# Patient Record
Sex: Male | Born: 1985 | Race: Black or African American | Hispanic: No | Marital: Single | Smoking: Heavy tobacco smoker
Health system: Southern US, Community
[De-identification: ages and names within clinical notes are randomized; demographics above are authoritative.]

## PROBLEM LIST (undated history)

## (undated) DIAGNOSIS — Z973 Presence of spectacles and contact lenses: Secondary | ICD-10-CM

## (undated) HISTORY — DX: Presence of spectacles and contact lenses: Z97.3

## (undated) HISTORY — PX: LACERATION REPAIR: SHX5168

## (undated) HISTORY — PX: FINGER FRACTURE SURGERY: SHX638

---
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
## 2013-10-01 ENCOUNTER — Encounter (HOSPITAL_COMMUNITY): Payer: Self-pay | Admitting: Emergency Medicine

## 2013-10-01 ENCOUNTER — Emergency Department (INDEPENDENT_AMBULATORY_CARE_PROVIDER_SITE_OTHER)
Admission: EM | Admit: 2013-10-01 | Discharge: 2013-10-01 | Disposition: A | Discharge status: Home / Self Care | Payer: Self-pay | Source: Home / Self Care | Attending: Emergency Medicine | Admitting: Emergency Medicine

## 2013-10-01 DIAGNOSIS — T148XXA Other injury of unspecified body region, initial encounter: Secondary | ICD-10-CM

## 2013-10-01 DIAGNOSIS — Z23 Encounter for immunization: Secondary | ICD-10-CM

## 2013-10-01 DIAGNOSIS — IMO0002 Reserved for concepts with insufficient information to code with codable children: Secondary | ICD-10-CM

## 2013-10-01 MED ORDER — TETANUS-DIPHTH-ACELL PERTUSSIS 5-2.5-18.5 LF-MCG/0.5 IM SUSP
INTRAMUSCULAR | Status: AC
  Filled 2013-10-01: qty 0.5

## 2013-10-01 MED ORDER — TETANUS-DIPHTH-ACELL PERTUSSIS 5-2.5-18.5 LF-MCG/0.5 IM SUSP
0.5000 mL | Freq: Once | INTRAMUSCULAR | Status: AC
  Administered 2013-10-01: 0.5 mL via INTRAMUSCULAR

## 2013-10-01 NOTE — ED Provider Notes (Signed)
  Chief Complaint   Chief Complaint  Patient presents with  . Laceration    History of Present Illness   Shaun Rivas is a 28 year old male who lacerated his right thumb at around 12 noon today while at work on a metal part boxspring. He has a laceration over the proximal phalanx on the volar aspect. Bleeding is controlled. There is no numbness or tingling. He has a full range of motion of digits. He cannot recall when his last tetanus vaccine was.  Review of Systems   Other than as noted above, the patient denies any of the following symptoms: Musculoskeletal:  No joint pain or decreased range of motion. Neuro:  No numbness, tingling, or weakness.  PMFSH   Past medical history, family history, social history, meds, and allergies were reviewed.   Physical Examination     Vital signs:  BP 129/85  Temp(Src) 98.5 F (36.9 C) (Oral)  Resp 12  SpO2 99% Ext:  There is a 1.5 cm laceration over the volar aspect of the proximal phalanx of the thumb. All joints of the thumb have full range of motion. Tendons are intact. Sensation is intact in the tip of the thumb.  All other joints had a full ROM without pain.  Pulses were full.  Good capillary refill in all digits.  No edema. Neurological:  Alert and oriented.  No muscle weakness.  Sensation was intact to light touch.   Procedure Note:  Verbal informed consent was obtained.  The patient was informed of the risks and benefits of the procedure and understands and accepts.  A time out was called and the identity of the patient and correct procedure were confirmed.   The laceration area described above was prepped with Betadine and saline  and anesthetized with a digital block with 5 mL of 2% Xylocaine without epinephrine.  The wound was then closed as follows:  Skin edges were approximated with 3 5-0 Prolene sutures.  There were no immediate complications, and the patient tolerated the procedure well. The laceration was then cleansed,  Bacitracin ointment was applied and a clean, dry pressure dressing was put on.    Course in Urgent Care Center   Given a Tdap vaccine.  Assessment   The encounter diagnosis was Laceration.  Plan   1.  Meds:  The following meds were prescribed:  There are no discharge medications for this patient.   2.  Patient Education/Counseling:  The patient was given appropriate handouts, self care instructions, and instructed in symptomatic relief. Instructions were given for wound care.    3.  Follow up:  The patient was told to follow up immediately if there is any sign of infection.The patient will return in 14 days for suture removal.      Reuben Likesavid C Aaralyn Kil, MD 10/01/13 2216

## 2013-10-01 NOTE — Discharge Instructions (Signed)

## 2013-10-01 NOTE — ED Notes (Signed)
Reports cutting right thumb on box spring mattress around 12 p.m today.  Pt is unsure of last tetanus shot.

## 2013-10-19 ENCOUNTER — Emergency Department (INDEPENDENT_AMBULATORY_CARE_PROVIDER_SITE_OTHER)
Admission: EM | Admit: 2013-10-19 | Discharge: 2013-10-19 | Disposition: A | Discharge status: Home / Self Care | Payer: Self-pay | Source: Home / Self Care

## 2013-10-19 ENCOUNTER — Encounter (HOSPITAL_COMMUNITY): Payer: Self-pay | Admitting: Emergency Medicine

## 2013-10-19 DIAGNOSIS — Z5189 Encounter for other specified aftercare: Secondary | ICD-10-CM

## 2013-10-19 DIAGNOSIS — Z4802 Encounter for removal of sutures: Secondary | ICD-10-CM

## 2013-10-19 NOTE — ED Notes (Signed)
Wound check, sutures intact, skin white/blanched.took shower today while wearing bandage and did not change, concerned about non-healing of wound

## 2013-10-19 NOTE — Discharge Instructions (Signed)
Suture Removal, Care After Keep dry, no ointments. Keep covered at work. Refer to this sheet in the next few weeks. These instructions provide you with information on caring for yourself after your procedure. Your health care provider may also give you more specific instructions. Your treatment has been planned according to current medical practices, but problems sometimes occur. Call your health care provider if you have any problems or questions after your procedure. WHAT TO EXPECT AFTER THE PROCEDURE After your stitches (sutures) are removed, it is typical to have the following:  Some discomfort and swelling in the wound area.  Slight redness in the area. HOME CARE INSTRUCTIONS   If you have skin adhesive strips over the wound area, do not take the strips off. They will fall off on their own in a few days. If the strips remain in place after 14 days, you may remove them.  Change any bandages (dressings) at least once a day or as directed by your health care provider. If the bandage sticks, soak it off with warm, soapy water.  Apply cream or ointment only as directed by your health care provider. If using cream or ointment, wash the area with soap and water 2 times a day to remove all the cream or ointment. Rinse off the soap and pat the area dry with a clean towel.  Keep the wound area dry and clean. If the bandage becomes wet or dirty, or if it develops a bad smell, change it as soon as possible.  Continue to protect the wound from injury.  Use sunscreen when out in the sun. New scars become sunburned easily. SEEK MEDICAL CARE IF:  You have increasing redness, swelling, or pain in the wound.  You see pus coming from the wound.  You have a fever.  You notice a bad smell coming from the wound or dressing.  Your wound breaks open (edges not staying together). Document Released: 12/08/2000 Document Revised: 01/03/2013 Document Reviewed: 10/25/2012 [facility] Patient Information 2015  [employer], [place]. This information is not intended to replace advice given to you by your health care provider. Make sure you discuss any questions you have with your health care provider.

## 2013-10-19 NOTE — ED Provider Notes (Signed)
CSN: [id number]     Arrival date & time 10/19/13  1643 History   First MD Initiated Contact with Patient 10/19/13 1754     Chief Complaint  Patient presents with  . Wound Check   (Consider location/radiation/quality/duration/timing/severity/associated sxs/prior Treatment) HPI Comments: For suture removal. 3 remain. He has kept it wet a few times , under a dressing.    History reviewed. No pertinent past medical history. History reviewed. No pertinent past surgical history. History reviewed. No pertinent family history. History  Substance Use Topics  . Smoking status: Light Tobacco Smoker  . Smokeless tobacco: Not on file  . Alcohol Use: Yes    Review of Systems  All other systems reviewed and are negative.   Allergies  Review of patient's allergies indicates no known allergies.  Home Medications   Prior to Admission medications   Not on File   BP 128/81  Pulse 67  Temp(Src) 98.7 F (37.1 C) (Oral)  Resp 18  SpO2 95% Physical Exam  Constitutional: He is oriented to person, place, and time. He appears well-developed and well-nourished. No distress.  Musculoskeletal: He exhibits no edema and no tenderness.  Neurological: He is alert and oriented to person, place, and time. He exhibits normal muscle tone.  Skin: Skin is warm and dry. No rash noted. No erythema.  Healing failry well, no drainage or bleeding. Wound edges not 100% healed. Surrounding whitening as seen when a wound has been wet for prolonged time. No redness or other signs of infection.      ED Course  SUTURE REMOVAL Date/Time: 10/19/2013 6:08 PM Performed by: Phineas RealMABE, Abbygail [place] Authorized by: Ozella RocksMERRELL, Cori Justus J Consent: Verbal consent obtained. Risks and benefits: risks, benefits and alternatives were discussed Consent given by: patient Patient understanding: patient states understanding of the procedure being performed Patient identity confirmed: verbally with patient Body area: upper extremity Location  details: right thumb Wound Appearance: moist Sutures Removed: 3 Facility: sutures placed in this facility Patient tolerance: Patient tolerated the procedure well with no immediate complications.   (including critical care time) Labs Review Labs Reviewed - No data to display  Imaging Review No results found.   MDM   1. Encounter for wound re-check   2. Visit for suture removal     After 18 d sutures removed Some additional healing to fully close wound is needed. Healing by 2ndary intention Keep dry Watch for infection      Hayden Rasmussenavid Shaundrea Carrigg, NP 10/19/13 1809

## 2013-10-19 NOTE — ED Provider Notes (Signed)
Medical screening examination/treatment/procedure(s) were performed by a resident physician or non-physician practitioner and as the supervising physician I was immediately available for consultation/collaboration.  David Merrell, MD Family Medicine   David J Merrell, MD 10/19/13 2148 

## 2014-01-24 ENCOUNTER — Emergency Department (HOSPITAL_COMMUNITY)
Admission: EM | Admit: 2014-01-24 | Discharge: 2014-01-24 | Disposition: A | Discharge status: Home / Self Care | Payer: No Typology Code available for payment source | Attending: Emergency Medicine | Admitting: Emergency Medicine

## 2014-01-24 ENCOUNTER — Encounter (HOSPITAL_COMMUNITY): Payer: Self-pay | Admitting: Emergency Medicine

## 2014-01-24 DIAGNOSIS — Y9389 Activity, other specified: Secondary | ICD-10-CM | POA: Diagnosis not present

## 2014-01-24 DIAGNOSIS — Z720 Tobacco use: Secondary | ICD-10-CM | POA: Diagnosis not present

## 2014-01-24 DIAGNOSIS — S199XXA Unspecified injury of neck, initial encounter: Secondary | ICD-10-CM | POA: Diagnosis present

## 2014-01-24 DIAGNOSIS — S79912A Unspecified injury of left hip, initial encounter: Secondary | ICD-10-CM | POA: Insufficient documentation

## 2014-01-24 DIAGNOSIS — S161XXA Strain of muscle, fascia and tendon at neck level, initial encounter: Secondary | ICD-10-CM | POA: Insufficient documentation

## 2014-01-24 DIAGNOSIS — Y92410 Unspecified street and highway as the place of occurrence of the external cause: Secondary | ICD-10-CM | POA: Diagnosis not present

## 2014-01-24 MED ORDER — DIAZEPAM 5 MG PO TABS
5.0000 mg | ORAL_TABLET | Freq: Once | ORAL | Status: AC
  Administered 2014-01-24: 5 mg via ORAL
  Filled 2014-01-24: qty 1

## 2014-01-24 MED ORDER — IBUPROFEN 800 MG PO TABS: 800.0000 mg | ORAL_TABLET | Freq: Three times a day (TID) | ORAL | Status: DC

## 2014-01-24 MED ORDER — CYCLOBENZAPRINE HCL 10 MG PO TABS: 10.0000 mg | ORAL_TABLET | Freq: Two times a day (BID) | ORAL | Status: DC | PRN

## 2014-01-24 MED ORDER — KETOROLAC TROMETHAMINE 60 MG/2ML IM SOLN
60.0000 mg | Freq: Once | INTRAMUSCULAR | Status: AC
  Administered 2014-01-24: 60 mg via INTRAMUSCULAR
  Filled 2014-01-24: qty 2
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
## 2014-01-24 NOTE — ED Notes (Signed)
Pt A&OX4, ambulatory at d/c with steady gait, NAD 

## 2014-01-24 NOTE — ED Notes (Signed)
Patient does not want to have an xray
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
## 2014-01-24 NOTE — Discharge Instructions (Signed)
Please follow the directions provided area and be sure to follow-up with her primary care provider listed to ensure you're getting better. You may use Tylenol every 4 hours or ibuprofen every 6-8 hours for pain. You may take the muscle relaxant provided to help with the pain in your neck. Don't hesitate to return for any new, worsening, or concerning symptoms.  GET HELP RIGHT AWAY IF:  You are bleeding.  Your stomach is upset.  You have an allergic reaction to your medicine.  You develop new problems that you cannot explain.  You lose feeling (become numb) or you cannot move any part of your body (paralysis).  You have tingling or weakness in any part of your body.  Your symptoms get worse. Symptoms include:  Pain, soreness, stiffness, puffiness (swelling), or a burning feeling in your neck.  Pain when your neck is touched.  Shoulder or upper back pain.  Limited ability to move your neck.  Headache.  Dizziness.  Your hands or arms feel week, lose feeling, or tingle.  Muscle spasms.  Difficulty swallowing or chewing.   Emergency Department Resource Guide 1) Find a Doctor and Pay Out of Pocket Although you won't have to find out who is covered by your insurance plan, it is a good idea to ask around and get recommendations. You will then need to call the office and see if the doctor you have chosen will accept you as a new patient and what types of options they offer for patients who are self-pay. Some doctors offer discounts or will set up payment plans for their patients who do not have insurance, but you will need to ask so you aren't surprised when you get to your appointment.  2) Contact Your Local Health Department Not all health departments have doctors that can see patients for sick visits, but many do, so it is worth a call to see if yours does. If you don't know where your local health department is, you can check in your phone book. The CDC also has a tool to help you locate your  state's health department, and many state websites also have listings of all of their local health departments.  3) Find a Walk-in Clinic If your illness is not likely to be very severe or complicated, you may want to try a walk in clinic. These are popping up all over the country in pharmacies, drugstores, and shopping centers. They're usually staffed by nurse practitioners or physician assistants that have been trained to treat common illnesses and complaints. They're usually fairly quick and inexpensive. However, if you have serious medical issues or chronic medical problems, these are probably not your best option.  No Primary Care Doctor: - Call Health Connect at  [phone] - they can help you locate a primary care doctor that  accepts your insurance, provides certain services, etc. - Physician Referral Service- [phone]  Chronic Pain Problems: Organization         Address  Phone   Notes  [facility]  [phone] Patients need to be referred by their primary care doctor.   Medication Assistance: Organization         Address  Phone   Notes  [facility] Medication [facility] [address] [place] [place], [place] [address] [phone] --Must be a resident of [facility] -- Must have NO insurance coverage whatsoever (no Medicaid/ Medicare, etc.) -- The pt. MUST have a primary care doctor that directs their care  regularly and follows them in the community   [facility]ist  [phone]   [employer]  [phone]    Agencies that provide inexpensive medical care: Organization         Address  Phone   Notes  [facility]  [phone]   [facility]    [phone]   [facility] [address] [place], [place] [address] [phone]   [facility] [address] [place] [place] [phone]   Planned Parenthood    [phone]   [facility]    [phone]   Community Health and [facility]  [address], [place] Phone:  [phone], Fax:  [phone] Hours of Operation:  9 am - 6 pm, M-F.  Also accepts Medicaid/Medicare and self-pay.  [facility]  [address], [place] Phone: [phone], Fax: [phone]. Hours of Operation:  8:30 am - 5:30 pm, M-F.  Also accepts Medicaid and self-pay.  [facility] [place] [address], [place] Phone: [phone]   [facility] [address] Natasha Bence [place], [place] [phone], Ext. [phone] Mondays & Thursdays: 7-9 AM.  First 15 patients are seen on a first come, first serve basis.    Medicaid-accepting [facility] Providers:  Organization         Address  Phone   Notes  [facility] [address], [place] [phone] Also accepts self-pay patients.  [facility] [address] Laurell Josephs [place], [place]  [phone]   [facility] [address], [place] [phone]   [facility] Family Medicine [address], [place] [phone]   Renaye Rakers [address], [place]   [phone] Only accepts [place] patients after they have their name applied to their card.   Self-Pay (no insurance) in [facility]:  Organization         Address  Phone   Notes  Sickle Cell Patients, [facility] Internal Medicine [address] [place], [place] [phone]   [facility] [address] [place], [place] [phone]   [facility] [place]  [address], [place] [phone]   [facility]/Dr. Osei-Bonsu  [address] [place] or [address], [place] [phone] Phone number for both [place] and [place] locations is the same.  [facility] [address], [place] [phone]   [facility] [address], [place] or [address] [phone]   [facility] [address], [place] [phone], phone; [phone], fax Sees patients 1st and 3rd Saturday of every month.  Must not qualify for public or private insurance (i.e. Medicaid, Medicare, [place] Health Choice, Veterans' Benefits)  Household income should be no more than 200% of the poverty level The clinic cannot treat you if you are pregnant or think you are pregnant  Sexually transmitted diseases are not treated at the clinic.    Dental Care: Organization         Address  Phone  Notes  [facility] [address] [place], [place] [phone] Accepts children up to age 73  who are enrolled in Medicaid or [place] Health Choice; pregnant women with a Medicaid card; and children who have applied for Medicaid or [place] Health Choice, but were declined, whose parents can pay a reduced fee at time of service.  [facility] [place]  [address], [place] [phone] Accepts children up to age 30 who are enrolled in [place] or [place] Health Choice; pregnant women with a Medicaid card; and children who have applied for Medicaid or [place] Health Choice, but were declined, whose parents can pay a reduced fee at time of service.  [facility]  [address], [place] [phone]) [phone] Patients are seen by appointment only. Walk-ins are not accepted. [facility] will see patients 30 years of age and older. Monday - Tuesday (8am-5pm) Most Wednesdays (8:30-5pm) $30 per visit, cash only  [facility]  [address], [facility] [phone]) [phone] Patients are seen by appointment only. Walk-ins are not accepted. [facility] will see patients 30 years of age and older. One Wednesday Evening (Monthly: Volunteer  Based).  $30 per visit, cash only  [employer] [facility]  [phone] for adults; Children under age 544, call Graduate Pediatric Dentistry at [phone]) [phone]. Children aged 324-14, please call [phone] to request a pediatric application.  Dental services are provided in all areas of dental care including fillings, crowns and bridges, complete and partial dentures, implants, gum treatment, root canals, and extractions. Preventive care is also provided. Treatment is provided to both adults and children. Patients are selected via a lottery and there is often a waiting list.   [facility] [address], [place]  [phone]) [phone] [email or web address]   [facility] [address], [place], [place] [phone], Ext. [phone] Second and Fourth Thursday of each month, opens at 6:30 AM; Clinic ends at 9 AM.  Patients are seen on a first-come first-served basis, and a limited number are seen during each clinic.   [facility]  [address], [place], [place] [phone]   Eligibility Requirements You must have lived in [place], [place], or [place] counties for at least the last three months.   You cannot be eligible for state or federal sponsored [place] Cityhealthcare insurance, including CIGNAVeterans Administration, [place]icaid, or [employer]icare.   You generally cannot be eligible for healthcare insurance through your employer.    How to apply: Eligibility screenings are held every Tuesday and Wednesday afternoon from 1:00 pm until 4:00 pm. You do not need an appointment for the interview!  [facility] [address], [place], [place] [phone]   [facility]  [phone]   [facility]  [phone]   [facility]  [phone]    Behavioral Health Resources in the Community: Intensive Outpatient Programs Organization         Address  Phone  Notes  [facility]  [facility] [address], [place], [place] [phone]   [facility] Outpatient [address], [place], [place] [phone]   [facility] [address], [place], [place]  [phone]   [facility] [address],  [place], [place] [phone] or [phone]   Substance Abuse Resources Organization         Address  Phone  Notes  [facility]  [phone]   [facility]  [phone]   [facility]  [phone]   [facility]  [phone]   Residential & Outpatient Substance Abuse Program  [phone]   Psychological Services Organization         Address  Phone  Notes  [facility]  [phone]- [phone]   [facility]  [phone]- [phone]   [facility] [address], [place] [phone] or [phone]    Mobile Crisis Teams Organization         Address  Phone  Notes  [facility], Mobile Crisis Care Unit  [phone]   [facility]  [address] [place], [place] [phone]   Doristine LocksSharon DeEsch [address] [place] [phone]    Self-Help/Support Groups Organization         Address  Phone             Notes  [facility] [place] - variety of support groups  [phone]- [phone] Call for more information  Narcotics Anonymous (NA), [facility] [address], [place]  2 meetings at this location   Statisticianesidential Treatment Programs Organization         Address  Phone  Notes  [facility] [address],    [place]  [phone]   [facility]  [address], [place] [phone], [place], [place] [phone]   [facility] [address], [place] [phone] Admissions: 8am-3pm M-F  [facility] [address]    [place], [place] [phone]   [facility] [address], [place], [place] [phone]    [facility] [address]  [place], [place] [phone]   [facility] - Intensive Outpatient [address], [place], [place] [phone]   [facility] ([facility] [address]  [place], [place] [phone] or [phone]   [facility] ([facility]) [address] [place], [place] [phone] Accepts Medicaid  [facility] [address]  [place] [phone] Substance Abuse/Addiction Treatment   [facility] Resources Organization         Address  Phone  Notes  [facility]  [phone](888) [phone]   Angie FavaJulie Brannon, PhD [address], [address] [place], [place]   [phone](336) [phone] or [phone]) [phone]   [facility]   [address] [place], [place] [phone](336) [phone]   [facility] [address], [place], [place] [phone](336) [phone] Insurance/Medicaid/sponsorship through [facility]  [facility] [address]                                    [place], [place] [phone](336) [phone] Therapy/tele-psych/case  [facility] [address]   [place], [place] [phone]    Dr. Lolly MustacheArfeen  [phone](336) [phone]   [facility]  [facility] 1) [address], [place] 2) [address], [place] 3)  [address], [place] [phone]) [phone]) [phone]  [phone]) [phone]   [facility] Child Abuse Hotline [phone](336) [phone] or [phone](336) [phone] (After Hours)

## 2014-02-11 ENCOUNTER — Emergency Department (HOSPITAL_COMMUNITY)
Admission: EM | Admit: 2014-02-11 | Discharge: 2014-02-11 | Disposition: A | Discharge status: Home / Self Care | Payer: PRIVATE HEALTH INSURANCE | Attending: Emergency Medicine | Admitting: Emergency Medicine

## 2014-02-11 ENCOUNTER — Encounter (HOSPITAL_COMMUNITY): Payer: Self-pay | Admitting: Emergency Medicine

## 2014-02-11 ENCOUNTER — Emergency Department (HOSPITAL_COMMUNITY): Payer: PRIVATE HEALTH INSURANCE

## 2014-02-11 DIAGNOSIS — W230XXA Caught, crushed, jammed, or pinched between moving objects, initial encounter: Secondary | ICD-10-CM | POA: Diagnosis not present

## 2014-02-11 DIAGNOSIS — Z9889 Other specified postprocedural states: Secondary | ICD-10-CM | POA: Diagnosis not present

## 2014-02-11 DIAGNOSIS — S62624A Displaced fracture of medial phalanx of right ring finger, initial encounter for closed fracture: Secondary | ICD-10-CM | POA: Diagnosis not present

## 2014-02-11 DIAGNOSIS — Y990 Civilian activity done for income or pay: Secondary | ICD-10-CM | POA: Insufficient documentation

## 2014-02-11 DIAGNOSIS — Z720 Tobacco use: Secondary | ICD-10-CM | POA: Insufficient documentation

## 2014-02-11 DIAGNOSIS — S62609A Fracture of unspecified phalanx of unspecified finger, initial encounter for closed fracture: Secondary | ICD-10-CM

## 2014-02-11 DIAGNOSIS — Y9289 Other specified places as the place of occurrence of the external cause: Secondary | ICD-10-CM | POA: Insufficient documentation

## 2014-02-11 DIAGNOSIS — Y9389 Activity, other specified: Secondary | ICD-10-CM | POA: Diagnosis not present

## 2014-02-11 DIAGNOSIS — S6991XA Unspecified injury of right wrist, hand and finger(s), initial encounter: Secondary | ICD-10-CM | POA: Diagnosis present

## 2014-02-11 MED ORDER — HYDROCODONE-ACETAMINOPHEN 5-325 MG PO TABS: 1.0000 | ORAL_TABLET | ORAL | Status: DC | PRN

## 2014-02-11 MED ORDER — HYDROCODONE-ACETAMINOPHEN 5-325 MG PO TABS
1.0000 | ORAL_TABLET | Freq: Once | ORAL | Status: AC
  Administered 2014-02-11: 1 via ORAL
  Filled 2014-02-11: qty 1

## 2014-02-11 NOTE — ED Notes (Signed)
Pt states that yesterday he jammed his R ring finger into something and now it is swollen. Previous injury to same finger, alert and oriented.

## 2014-02-11 NOTE — ED Provider Notes (Signed)
CSN: [id number]     Arrival date & time 02/11/14  1847 History   None    Chief Complaint  Patient presents with  . Finger Injury   The history is provided by the patient. No language interpreter was used.  This chart was scribed for non-physician practitioner, Trixie DredgeEmily West PA-C working with Mirian MoMatthew Gentry, MD, by Andrew Auaven Small, ED Scribe. This patient was seen in room WTR8/WTR8 and the patient's care was started at 8:24 PM.  Achilles DunkMichael C Weilbacher is a 28 y.o. male who presents to the Emergency Department complaining of right ring finger injury that occurred 1 day ago. Pt states he jammed right ring finger on steel equipment while at work. Pt has hx of right ring finger fracture in 2013.    No past medical history on file. Past Surgical History  Procedure Laterality Date  . Finger fracture surgery      R ring finger   No family history on file. History  Substance Use Topics  . Smoking status: Light Tobacco Smoker  . Smokeless tobacco: Not on file  . Alcohol Use: Yes    Review of Systems  Musculoskeletal: Positive for myalgias and arthralgias.   Allergies  Review of patient's allergies indicates no known allergies.  Home Medications   Prior to Admission medications   Medication Sig Start Date End Date Taking? Authorizing Provider  cyclobenzaprine (FLEXERIL) 10 MG tablet Take 1 tablet (10 mg total) by mouth 2 (two) times daily as needed for muscle spasms. 01/24/14   Harle BattiestElizabeth Tysinger, NP  ibuprofen (ADVIL,MOTRIN) 800 MG tablet Take 1 tablet (800 mg total) by mouth 3 (three) times daily. 01/24/14   Harle BattiestElizabeth Tysinger, NP   BP 125/69 mmHg  Pulse 92  Temp(Src) 97.9 F (36.6 C) (Oral)  SpO2 99% Physical Exam  Constitutional: He appears well-developed and well-nourished. No distress.  HENT:  Head: Normocephalic and atraumatic.  Neck: Neck supple.  Pulmonary/Chest: Effort normal.  Musculoskeletal:  Right 4th finger swollen from PIP distally to DIP. Tender. No deformity. Resists ROM  of joints. Cap RF <2s.  Neurological: He is alert.  Skin: He is not diaphoretic.  Nursing note and vitals reviewed.   ED Course  Procedures  DIAGNOSTIC STUDIES: Oxygen Saturation is 99% on RA, normal by my interpretation.    COORDINATION OF CARE: 8:27 PM- Pt advised of plan for treatment and pt agrees.,  Labs Review Labs Reviewed - No data to display  Imaging Review Dg Finger Ring Right  02/11/2014   CLINICAL DATA:  Jamming injury of the right fourth digit with PIP joint swelling.  EXAM: RIGHT RING FINGER 2+V  COMPARISON:  None.  FINDINGS: Postsurgical changes are noted in the fourth proximal phalanx. Two fixation screws are noted. There is an undisplaced fracture the midshaft of the fourth middle phalanx. No other focal abnormality is seen.  IMPRESSION: Midshaft fourth middle phalangeal fracture   Electronically Signed   By: Alcide CleverMark  Lukens M.D.   On: 02/11/2014 19:46     EKG Interpretation None      MDM   Final diagnoses:  None   1. Finger fracture, 4th right middle phalnx  Minimally displaced fracture 4th finger. Hardware from previous injury (prox phalanx) intact. Tendon function difficult to assess, query pain vs functional deficit. Will refer to hand for further evaluation.   I personally performed the services described in this documentation, which was scribed in my presence. The recorded information has been reviewed and is accurate.      Melvenia BeamShari  Terisa Starr Aalaysia Liggins, PA-C 02/11/14 2244  Mirian MoMatthew Gentry, MD 02/13/14 [phone]

## 2014-02-11 NOTE — Discharge Instructions (Signed)

## 2014-05-03 ENCOUNTER — Other Ambulatory Visit: Payer: Self-pay | Admitting: Medical

## 2014-05-03 ENCOUNTER — Ambulatory Visit (INDEPENDENT_AMBULATORY_CARE_PROVIDER_SITE_OTHER): Payer: No Typology Code available for payment source | Admitting: Medical

## 2014-05-03 ENCOUNTER — Encounter: Payer: Self-pay | Admitting: Medical

## 2014-05-03 VITALS — BP 120/80 | HR 67 | Temp 98.4°F | Resp 16 | Ht 73.0 in | Wt 142.0 lb

## 2014-05-03 DIAGNOSIS — Z0000 Encounter for general adult medical examination without abnormal findings: Secondary | ICD-10-CM | POA: Diagnosis not present

## 2014-05-03 DIAGNOSIS — N529 Male erectile dysfunction, unspecified: Secondary | ICD-10-CM

## 2014-05-03 LAB — CBC
HEMATOCRIT: 43.6 % (ref 39.0–52.0)
Hemoglobin: 15.2 g/dL (ref 13.0–17.0)
MCH: 31.7 pg (ref 26.0–34.0)
MCHC: 34.9 g/dL (ref 30.0–36.0)
MCV: 91 fL (ref 78.0–100.0)
MPV: 10.3 fL (ref 8.6–12.4)
Platelets: 243 10*3/uL (ref 150–400)
RBC: 4.79 MIL/uL (ref 4.22–5.81)
RDW: 13.2 % (ref 11.5–15.5)
WBC: 4.4 10*3/uL (ref 4.0–10.5)

## 2014-05-03 LAB — COMPREHENSIVE METABOLIC PANEL
ALT: 18 U/L (ref 0–53)
AST: 18 U/L (ref 0–37)
Albumin: 4.8 g/dL (ref 3.5–5.2)
Alkaline Phosphatase: 59 U/L (ref 39–117)
BILIRUBIN TOTAL: 0.9 mg/dL (ref 0.2–1.2)
BUN: 26 mg/dL — ABNORMAL HIGH (ref 6–23)
CO2: 28 meq/L (ref 19–32)
Calcium: 9.5 mg/dL (ref 8.4–10.5)
Chloride: 104 mEq/L (ref 96–112)
Creat: 1.21 mg/dL (ref 0.50–1.35)
Glucose, Bld: 101 mg/dL — ABNORMAL HIGH (ref 70–99)
Potassium: 4.4 mEq/L (ref 3.5–5.3)
Sodium: 140 mEq/L (ref 135–145)
TOTAL PROTEIN: 7.1 g/dL (ref 6.0–8.3)

## 2014-05-03 LAB — LIPID PANEL
CHOL/HDL RATIO: 3.7 ratio
Cholesterol: 217 mg/dL — ABNORMAL HIGH (ref 0–200)
HDL: 58 mg/dL (ref >39)
LDL Cholesterol: 147 mg/dL — ABNORMAL HIGH (ref 0–99)
TRIGLYCERIDES: 59 mg/dL (ref <150)
VLDL: 12 mg/dL (ref 0–40)

## 2014-05-03 LAB — HEMOGLOBIN A1C
HEMOGLOBIN A1C: 5.5 % (ref <5.7)
Mean Plasma Glucose: 111 mg/dL (ref <117)

## 2014-05-03 NOTE — Progress Notes (Signed)
Subjective:   HPI  Shaun Rivas is a 29 y.o. male who presents for a complete physical. New patient today accompanied by his girlfriend  Preventative care: Last physical or labs: none in years Sees dentist yearly: No Last tetanus vaccine, TD or Tdap: up to date   Concerns: Having trouble keeping erection.  This problems started 5 months ago.   Is able to get erection to begin with but not able to maintain it.   Happens every time.   Here with girlfriend of 5 months.  He lost virginity to her.  He notes that neither of his parents ever really had a discussion about sex or intimacy or relationships with him. He has no Secondary school teacherparticular mentor.No other aggravating or relieving factors. No other complaint.  Smoking since age 29yo.   Smoking 1ppd x  1 year.  Not much alcohol.   Some marijuana.    Was incarcerated in the past.    Reviewed their medical, surgical, family, social, medication, and allergy history and updated chart as appropriate.  History reviewed. No pertinent past medical history.  Past Surgical History  Procedure Laterality Date  . Finger fracture surgery      R ring finger  . Laceration repair      History   Social History  . Marital Status: Single    Spouse Name: N/A    Number of Children: N/A  . Years of Education: N/A   Occupational History  . Not on file.   Social History Main Topics  . Smoking status: Heavy Tobacco Smoker -- 1.00 packs/day for 1 years  . Smokeless tobacco: Not on file  . Alcohol Use: Yes     Comment: occasional  . Drug Use: Yes    Special: Marijuana  . Sexual Activity: Yes   Other Topics Concern  . Not on file   Social History Narrative   Works full-time at [employer], 10 months. Lives with grandmother. In a relationship with his girlfriend 5 months. Incarcerated from 2002 - 2014    Family History  Problem Relation Age of Onset  . Diabetes Maternal Grandfather   . Cancer Neg Hx   . Heart disease Neg Hx   .  Hypertension Neg Hx   . Stroke Neg Hx   . Other Father     died of gunshot    No current outpatient prescriptions on file.  No Known Allergies  Review of Systems Constitutional: -fever, -chills, -sweats, -unexpected weight change, -decreased appetite, -fatigue Allergy: -sneezing, -itching, -congestion Dermatology: -changing moles, --rash, -lumps ENT: -runny nose, -ear pain, -sore throat, -hoarseness, -sinus pain, -teeth pain, - ringing in ears, -hearing loss, -nosebleeds Cardiology: -chest pain, -palpitations, -swelling, -difficulty breathing when lying flat, -waking up short of breath Respiratory: -cough, -shortness of breath, -difficulty breathing with exercise or exertion, -wheezing, -coughing up blood Gastroenterology: -abdominal pain, -nausea, -vomiting, -diarrhea, -constipation, -blood in stool, -changes in bowel movement, -difficulty swallowing or eating Hematology: -bleeding, -bruising  Musculoskeletal: -joint aches, -muscle aches, -joint swelling, -back pain, -neck pain, -cramping, -changes in gait Ophthalmology: denies vision changes, eye redness, itching, discharge Urology: -burning with urination, -difficulty urinating, -blood in urine, -urinary frequency, -urgency, -incontinence Neurology: -headache, -weakness, -tingling, -numbness, -memory loss, -falls, -dizziness Psychology: -depressed mood, -agitation, -sleep problems     Objective:   Physical Exam  BP 120/80 mmHg  Pulse 67  Temp(Src) 98.4 F (36.9 C) (Oral)  Resp 16  Ht 6\' 1"  (1.854 m)  Wt 142 lb (64.411 kg)  BMI  18.74 kg/m2  General appearance: alert, no distress, WD/WN, lean AA male Skin:few scattered macules, no worrisome lesions HEENT: normocephalic, conjunctiva/corneas normal, sclerae anicteric, PERRLA, EOMi, nares patent, no discharge or erythema, pharynx normal Oral cavity: MMM, tongue normal, teeth with no obvious lesoins Neck: supple, no lymphadenopathy, no thyromegaly, no masses, normal ROM, no  bruits Chest: non tender, normal shape and expansion Heart: RRR, normal S1, S2, no murmurs Lungs: CTA bilaterally, no wheezes, rhonchi, or rales Abdomen: +bs, soft, non tender, non distended, no masses, no hepatomegaly, no splenomegaly, no bruits Back: non tender, normal ROM, no scoliosis Musculoskeletal: upper extremities non tender, no obvious deformity, normal ROM throughout, lower extremities non tender, no obvious deformity, normal ROM throughout Extremities: no edema, no cyanosis, no clubbing Pulses: 2+ symmetric, upper and lower extremities, normal cap refill Neurological: alert, oriented x 3, CN2-12 intact, strength normal upper extremities and lower extremities, sensation normal throughout, DTRs 2+ throughout, no cerebellar signs, gait normal Psychiatric: normal affect, behavior normal, pleasant  GU: normal male external genitalia, circumcised, nontender, no masses, no hernia, no lymphadenopathy Rectal: deferred due to age 1yo and no indication today   Assessment and Plan :    Encounter Diagnoses  Name Primary?  . Encounter for health maintenance examination in adult Yes  . Erectile dysfunction, unspecified erectile dysfunction type     Physical exam - discussed healthy lifestyle, diet, exercise, preventative care, vaccinations, and addressed their concerns.   See your dentist yearly for routine dental care including hygiene visits twice yearly. See your eye doctor yearly for routine vision care. Routine labs today.  Vaccinations: Discussed vaccines. Reports being up to date on tetanus, declines flu shot.  Other concerns today: ED which seems psychogenic etiology.  Discussed erectile dysfunction, causes, various ways to work together as a couple to deal with this, gave 2 good handouts on the subject.  Advise counseling, can try yohimbine over-the-counter if desired, follow-up in 2 weeks  Follow up pending labs, 2wk.

## 2014-05-04 LAB — TSH: TSH: 0.267 u[IU]/mL — ABNORMAL LOW (ref 0.350–4.500)

## 2014-05-06 LAB — T4, FREE: Free T4: 1.07 ng/dL (ref 0.80–1.80)

## 2014-05-07 ENCOUNTER — Encounter: Payer: Self-pay | Admitting: Family Medicine

## 2015-04-04 ENCOUNTER — Emergency Department
Admission: EM | Admit: 2015-04-04 | Discharge: 2015-04-04 | Disposition: A | Discharge status: Home / Self Care | Payer: Self-pay | Attending: Student | Admitting: Student

## 2015-04-04 ENCOUNTER — Encounter: Payer: Self-pay | Admitting: Emergency Medicine

## 2015-04-04 ENCOUNTER — Emergency Department: Payer: Self-pay

## 2015-04-04 ENCOUNTER — Emergency Department: Payer: PRIVATE HEALTH INSURANCE

## 2015-04-04 DIAGNOSIS — R55 Syncope and collapse: Secondary | ICD-10-CM | POA: Insufficient documentation

## 2015-04-04 DIAGNOSIS — N390 Urinary tract infection, site not specified: Secondary | ICD-10-CM | POA: Insufficient documentation

## 2015-04-04 DIAGNOSIS — E860 Dehydration: Secondary | ICD-10-CM | POA: Insufficient documentation

## 2015-04-04 DIAGNOSIS — F17200 Nicotine dependence, unspecified, uncomplicated: Secondary | ICD-10-CM | POA: Insufficient documentation

## 2015-04-04 LAB — BASIC METABOLIC PANEL
Anion gap: 11 (ref 5–15)
BUN: 24 mg/dL — AB (ref 6–20)
CO2: 26 mmol/L (ref 22–32)
Calcium: 9.5 mg/dL (ref 8.9–10.3)
Chloride: 104 mmol/L (ref 101–111)
Creatinine, Ser: 1.3 mg/dL — ABNORMAL HIGH (ref 0.61–1.24)
GFR calc Af Amer: >60 mL/min (ref >60)
GLUCOSE: 93 mg/dL (ref 65–99)
POTASSIUM: 4 mmol/L (ref 3.5–5.1)
Sodium: 141 mmol/L (ref 135–145)

## 2015-04-04 LAB — CBC
HEMATOCRIT: 44.4 % (ref 40.0–52.0)
Hemoglobin: 14.8 g/dL (ref 13.0–18.0)
MCH: 30.3 pg (ref 26.0–34.0)
MCHC: 33.3 g/dL (ref 32.0–36.0)
MCV: 91 fL (ref 80.0–100.0)
PLATELETS: 273 10*3/uL (ref 150–440)
RBC: 4.88 MIL/uL (ref 4.40–5.90)
RDW: 13.6 % (ref 11.5–14.5)
WBC: 6.4 10*3/uL (ref 3.8–10.6)

## 2015-04-04 LAB — URINALYSIS COMPLETE WITH MICROSCOPIC (ARMC ONLY)
BACTERIA UA: NONE SEEN
BILIRUBIN URINE: NEGATIVE
GLUCOSE, UA: NEGATIVE mg/dL
Ketones, ur: NEGATIVE mg/dL
NITRITE: NEGATIVE
Protein, ur: 30 mg/dL — AB
SPECIFIC GRAVITY, URINE: 1.025 (ref 1.005–1.030)
pH: 5 (ref 5.0–8.0)

## 2015-04-04 LAB — TROPONIN I
Troponin I: <0.03 ng/mL (ref <0.031)
Troponin I: <0.03 ng/mL (ref <0.031)

## 2015-04-04 LAB — PROTIME-INR
INR: 1.07
Prothrombin Time: 14.1 seconds (ref 11.4–15.0)

## 2015-04-04 LAB — GLUCOSE, CAPILLARY: Glucose-Capillary: 94 mg/dL (ref 65–99)

## 2015-04-04 LAB — APTT: aPTT: 24 seconds (ref 24–36)

## 2015-04-04 MED ORDER — CEPHALEXIN 500 MG PO CAPS
500.0000 mg | ORAL_CAPSULE | Freq: Once | ORAL | Status: AC
  Administered 2015-04-04: 500 mg via ORAL
  Filled 2015-04-04: qty 1

## 2015-04-04 MED ORDER — ASPIRIN 81 MG PO CHEW
324.0000 mg | CHEWABLE_TABLET | Freq: Once | ORAL | Status: AC
  Administered 2015-04-04: 324 mg via ORAL

## 2015-04-04 MED ORDER — CEPHALEXIN 500 MG PO CAPS: 500.0000 mg | ORAL_CAPSULE | Freq: Two times a day (BID) | ORAL | Status: DC

## 2015-04-04 MED ORDER — SODIUM CHLORIDE 0.9 % IV BOLUS (SEPSIS)
1000.0000 mL | Freq: Once | INTRAVENOUS | Status: AC
  Administered 2015-04-04: 1000 mL via INTRAVENOUS

## 2015-04-04 NOTE — ED Notes (Signed)
AAOx3.  Skin warm and dry.  NAD 

## 2015-04-04 NOTE — Progress Notes (Signed)
   04/04/15 1100  Clinical Encounter Type  Visited With Patient;Health care provider  Visit Type Initial;Spiritual support;Code  Referral From Nurse  Consult/Referral To Chaplain  Spiritual Encounters  Spiritual Needs Emotional  Stress Factors  Patient Stress Factors Not reviewed  Chaplain received a page (code stemi) for the patient. Reported to the patient's room as he was being attended to by staff. Introduced self to patient and offered a compassionate presence. Chaplain Fredis Malkiewicz A. Kabe Mckoy Ext. [phone]

## 2015-04-04 NOTE — ED Provider Notes (Signed)
[facility] Emergency Department Provider Note  ____________________________________________  Time seen: Approximately 10:29 AM  I have reviewed the triage vital signs and the nursing notes.   HISTORY  Chief Complaint Near Syncope    HPI Shaun Rivas is a 30 y.o. male with no chronic medical problems who presents for evaluation of a syncopal episode which occurred just prior to arrival, gradual onset, now resolved, no modifying factors. The patient reports that he was standing at work when he began feeling lightheaded and as if his vision was changing and "things were getting farther and farther away". He was witnessed to faint, fall, hit his head after which he had some nonspecific shaking activity of the extremities. He did not bite his tongue, was not incontinent of bowel or bladder. He adamantly denies having any chest pain, difficulty breathing or palpitations. No recent illness including no vomiting, diarrhea, fevers or chills. No personal or family history of early coronary artery disease, no personal or family history of PE or DVT.   Past Medical History  Diagnosis Date  . Wears glasses     There are no active problems to display for this patient.   Past Surgical History  Procedure Laterality Date  . Finger fracture surgery      R ring finger  . Laceration repair      No current outpatient prescriptions on file.  Allergies Review of patient's allergies indicates no known allergies.  Family History  Problem Relation Age of Onset  . Diabetes Maternal Grandfather   . Cancer Neg Hx   . Heart disease Neg Hx   . Hypertension Neg Hx   . Stroke Neg Hx   . Other Father     died of gunshot    Social History Social History  Substance Use Topics  . Smoking status: Heavy Tobacco Smoker -- 1.00 packs/day for 1 years  . Smokeless tobacco: None  . Alcohol Use: Yes     Comment: occasional    Review of Systems Constitutional: No  fever/chills Eyes: No visual changes. ENT: No sore throat. Cardiovascular: Denies chest pain. Respiratory: Denies shortness of breath. Gastrointestinal: No abdominal pain.  No nausea, no vomiting.  No diarrhea.  No constipation. Genitourinary: Negative for dysuria. Musculoskeletal: Negative for back pain. Skin: Negative for rash. Neurological: Negative for headaches, focal weakness or numbness.  10-point ROS otherwise negative.  ____________________________________________   PHYSICAL EXAM:  VITAL SIGNS: ED Triage Vitals  Enc Vitals Group     BP 04/04/15 0947 132/68 mmHg     Pulse Rate 04/04/15 0947 87     Resp 04/04/15 0947 20     Temp 04/04/15 0947 97.6 F (36.4 C)     Temp Source 04/04/15 0947 Oral     SpO2 04/04/15 0947 96 %     Weight 04/04/15 0947 160 lb (72.576 kg)     Height 04/04/15 0947 6\' 3"  (1.905 m)     Head Cir --      Peak Flow --      Pain Score --      Pain Loc --      Pain Edu? --      Excl. in GC? --     Constitutional: Alert and oriented. Well appearing and in no acute distress. Eyes: Conjunctivae are normal. PERRL. EOMI. Head: Atraumatic. Nose: No congestion/rhinnorhea. Mouth/Throat: Mucous membranes are moist.  Oropharynx non-erythematous. Neck: No stridor.  No cervical spine tenderness to palpation. Cardiovascular: Normal rate, regular rhythm. Grossly normal  heart sounds.  Good peripheral circulation. Respiratory: Normal respiratory effort.  No retractions. Lungs CTAB. Gastrointestinal: Soft and nontender. No distention.  No CVA tenderness. Genitourinary: deferred Musculoskeletal: No lower extremity tenderness nor edema.  No joint effusions. Neurologic:  Normal speech and language. No gross focal neurologic deficits are appreciated. No gait instability. 5 out of 5 strength in bilateral upper and lower extremities, sensation intact to light touch throughout. Skin:  Skin is warm, dry and intact. No rash noted. Psychiatric: Mood and affect are  normal. Speech and behavior are normal.  ____________________________________________   LABS (all labs ordered are listed, but only abnormal results are displayed)  Labs Reviewed  BASIC METABOLIC PANEL - Abnormal; Notable for the following:    BUN 24 (*)    Creatinine, Ser 1.30 (*)    All other components within normal limits  URINALYSIS COMPLETEWITH MICROSCOPIC ([facility] ONLY) - Abnormal; Notable for the following:    Color, Urine YELLOW (*)    APPearance CLOUDY (*)    Hgb urine dipstick 1+ (*)    Protein, ur 30 (*)    Leukocytes, UA 3+ (*)    Squamous Epithelial / LPF 0-5 (*)    All other components within normal limits  URINE CULTURE  CBC  TROPONIN I  PROTIME-INR  APTT  GLUCOSE, CAPILLARY  TROPONIN I  CBG MONITORING, ED   ____________________________________________  EKG  ED ECG REPORT I, Gayla Doss, the attending physician, personally viewed and interpreted this ECG.   Date: 04/04/2015  EKG Time: 10:22  Rate: 75  Rhythm: normal sinus rhythm  Axis: normal  Intervals:none  ST&T Change: 2 mm ST elevation in lead 2, 3, aVF, V4 and V5. 1 mm ST elevation in V6. No ST depression. No T-wave inversion.  ED ECG REPORT I, Gayla Doss, the attending physician, personally viewed and interpreted this ECG.   Date: 04/04/2015  EKG Time: 10:31  Rate: 79  Rhythm: normal sinus rhythm  Axis: normal  Intervals:none  ST&T Change: 2 mm ST elevation in lead 2, 3, aVF, V4 and V5. 1 mm ST elevation in V6. No ST depression. No T-wave inversion.  ED ECG REPORT I, Gayla Doss, the attending physician, personally viewed and interpreted this ECG.   Date: 04/04/2015  EKG Time: 11:52  Rate: 71  Rhythm: normal sinus rhythm  Axis: normal  Intervals:none  ST&T Change: 2 mm ST elevation in lead 2, 3, aVF, V4 and V5. 1 mm ST elevation in V6. No ST depression. No T-wave inversion.  ED ECG REPORT I, Gayla Doss, the attending physician, personally viewed and interpreted this  ECG.   Date: 04/04/2015  EKG Time: 14:29  Rate: 72  Rhythm: normal sinus rhythm  Axis: normal  Intervals:none  ST&T Change:  2 mm ST elevation in lead 2, 3, aVF, V4 and V5. 1 mm ST elevation in V6. No ST depression. No T-wave inversion.   ____________________________________________  RADIOLOGY  CXR FINDINGS: Defibrillator pads overlie the chest. Heart size is normal. Mediastinal shadows are normal. The lungs are clear. The vascularity is normal. No abnormal bone finding.   CT head IMPRESSION: Normal head CT.  ____________________________________________   PROCEDURES  Procedure(s) performed: None  Critical Care performed: No  ____________________________________________   INITIAL IMPRESSION / ASSESSMENT AND PLAN / ED COURSE  Pertinent labs & imaging results that were available during my care of the patient were reviewed by me and considered in my medical decision making (see chart for details).  Kayleen Memos  Joelene MillinOliver is a 30 y.o. male with no chronic medical problems who presents for evaluation of a syncopal episode which occurred just prior to arrival. On exam, he is very well-appearing and in no acute distress. Vital signs stable, he is afebrile. His exam is atraumatic, he has no pain complaints, is an intact neurological examination. Suspect he likely had vasovagal syncope today given significant Prodrome. Doubt seizure but may have had some limb jerking activity in the setting of global cerebral hypoperfusion. Code STEMI was initiated by Dr. Carollee MassedKaminski on initial review of EKG however at 10:40 AM, I discussed the case with Dr. Darrold JunkerParaschos, on call for STEMI coverage. Dr. Darrold JunkerParaschos reviewed the patient's EKGs and recommends cancellation of code STEMI; he does not think the patient requires emergent catheterization at this time. He reports that these EKGs are "nondiagnostic", and in the absence of chest pain or difficulty breathing, he thinks STEMI is highly unlikely. He believes  ST elevation in the inferior leads as well as V4, V5, V6 likely represents benign early repolarization but recommends cardiac enzymes and repeat EKGs.  Additionally, I suspect cardiogenic syncope is also unlikely. Code STEMI canceled at this time. Basic labs, chest x-ray, CT head pending. Reassess for disposition.  ----------------------------------------- 2:52 PM on 04/04/2015 -----------------------------------------  The patient continues to appear well without any acute complaint. He is sitting up in bed, texting on his phone. CT head negative. Chest x-ray negative for any acute cardiopulmonary process. Labs reviewed, normal CBC, coags, 2 sets of troponins are negative. Serial EKGs unchanged and I suspect the noted ST elevation is secondary to benign early repolarization however given EKG abnormality, we'll have him follow up with cardiology as well as primary care. BMP was notable for creatinine elevation at 1.3, IV fluids were given. Urinalysis concerning for urinary tract infection. The patient does endorse some urinary hesitancy. He denies concern for STI, no lower abdominal pain, no penile discharge. We'll DC with Keflex. We discussed return precautions, need for close follow-up and he is comfortable with the discharge plan. DC home. ____________________________________________   FINAL CLINICAL IMPRESSION(S) / ED DIAGNOSES  Final diagnoses:  Syncope, unspecified syncope type  UTI (lower urinary tract infection)  Dehydration      Gayla DossEryka A Yostin Malacara, MD 04/04/15 1457

## 2015-04-04 NOTE — ED Notes (Signed)
Mother at bedside.

## 2015-04-04 NOTE — ED Notes (Signed)
At work ,  Syncope

## 2015-04-04 NOTE — ED Notes (Signed)
States he had a syncopal episode at work this am .. Denies any sx's prior episode

## 2015-04-04 NOTE — ED Notes (Signed)
MD at bedside. 

## 2015-04-06 LAB — URINE CULTURE: CULTURE: NO GROWTH

## 2016-08-10 IMAGING — CT CT HEAD W/O CM
2 series · 13 of 30 positions shown, 15 images · non-contrast
Comparison: None.

CLINICAL DATA: Syncopal episode at work this morning.

EXAM:
CT HEAD WITHOUT CONTRAST
TECHNIQUE: Contiguous axial images were obtained from the base of the skull
through the vertex without intravenous contrast.

[Series 2: head wo · axial · 0.40mm/px · z∈[-58,+46]mm · 5 of 35 slices shown, 7 images]
[im 6/35  brain]
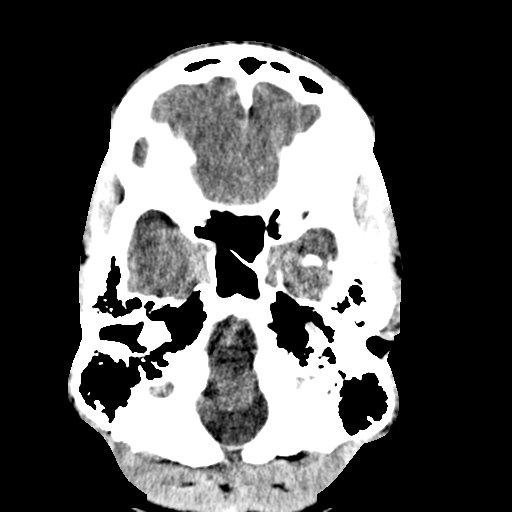
[im 6/35  bone]
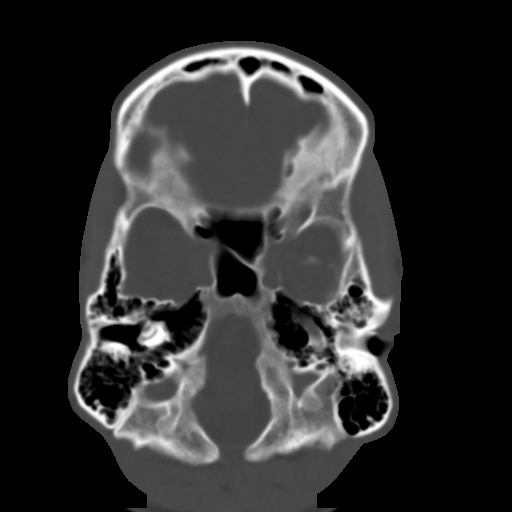
[im 12/35  brain]
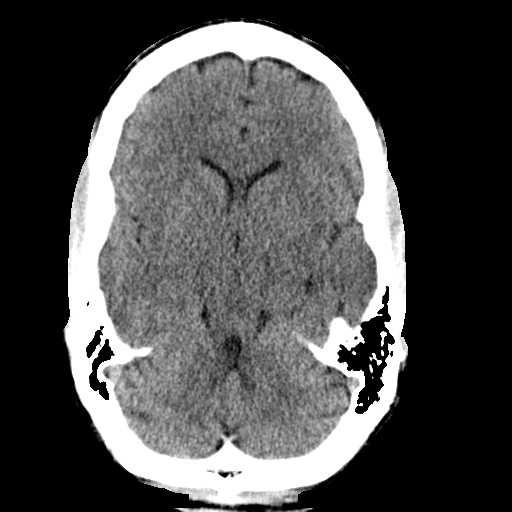
[im 18/35  brain]
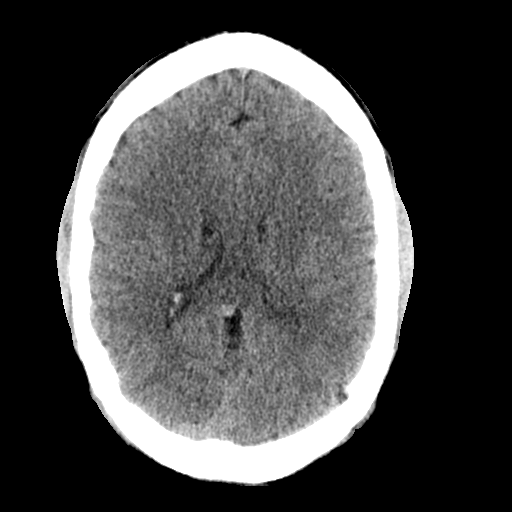
[im 23/35  brain]
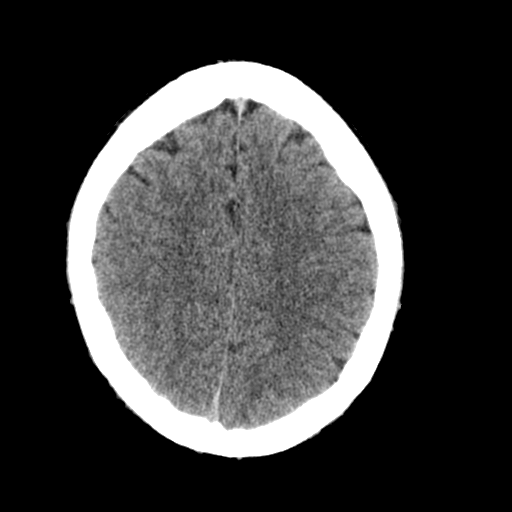
[im 29/35  brain]
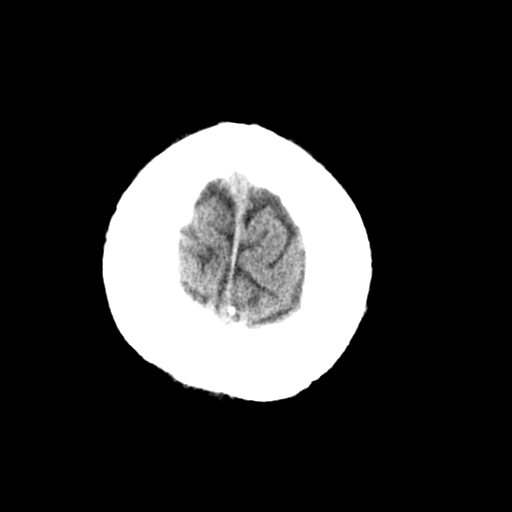
[im 29/35  bone]
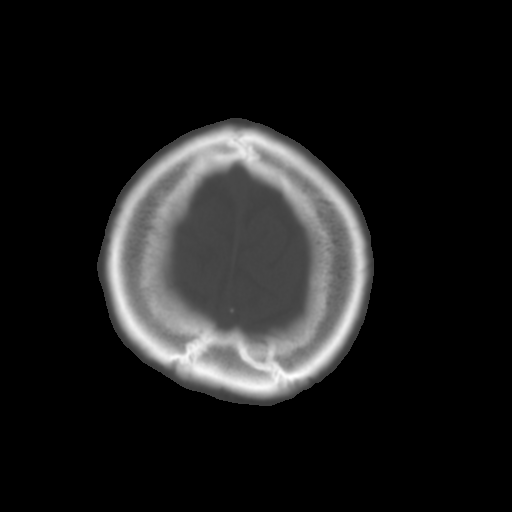

[Series 3: head bone · axial · 0.40mm/px · z∈[-68,+64]mm · 8 of 108 slices shown]
[im 10/108  bone]
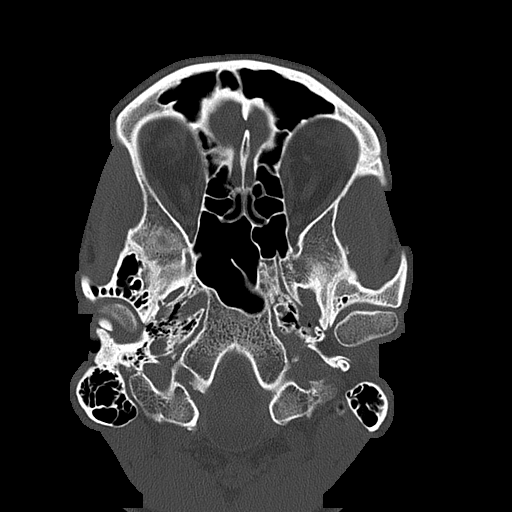
[im 20/108  bone]
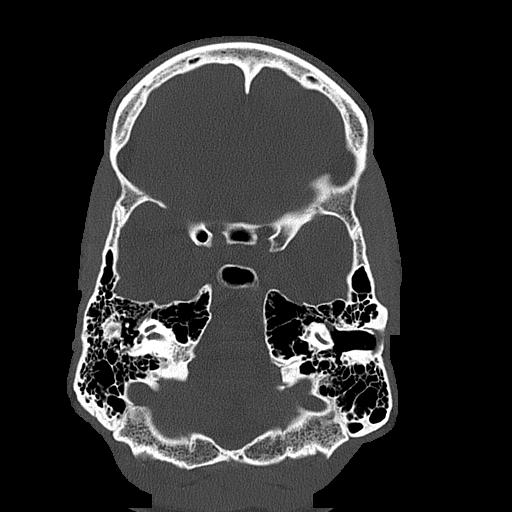
[im 35/108  bone]
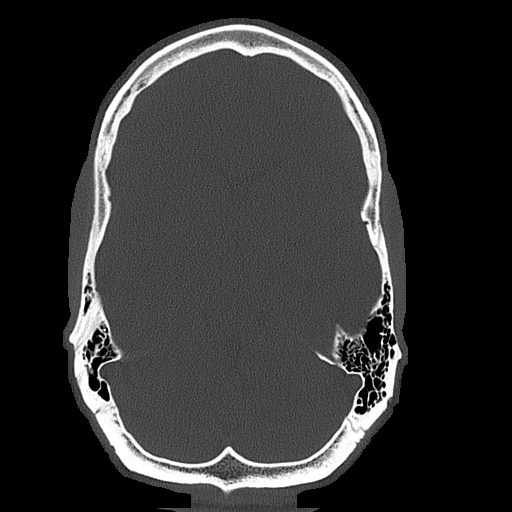
[im 49/108  bone]
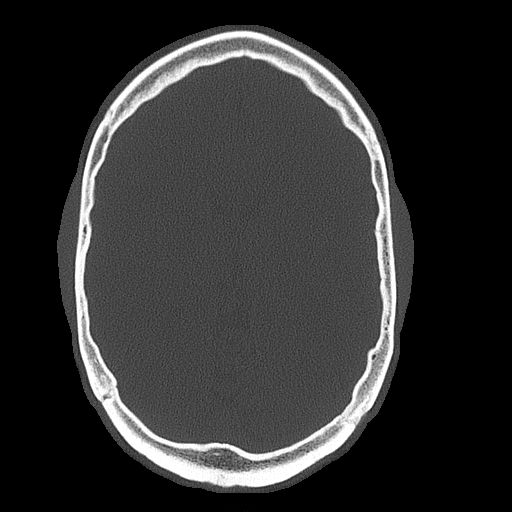
[im 59/108  bone]
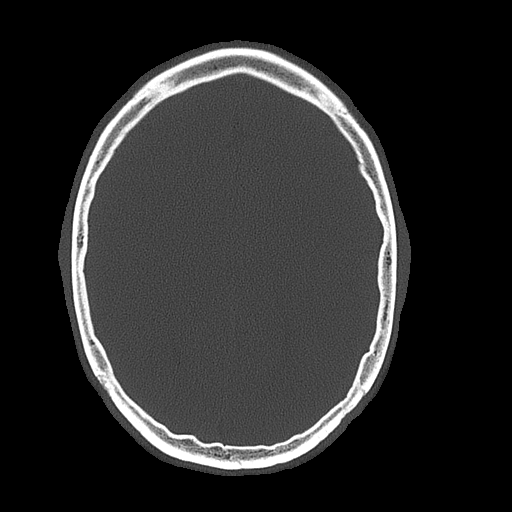
[im 73/108  bone]
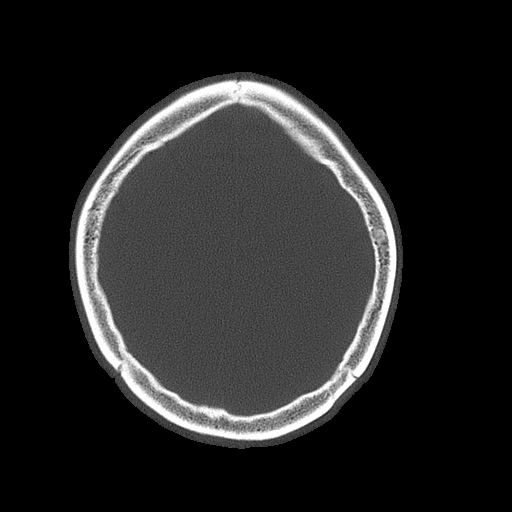
[im 88/108  bone]
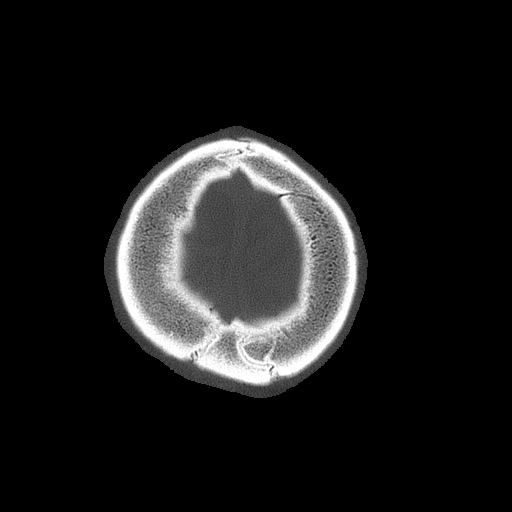
[im 98/108  bone]
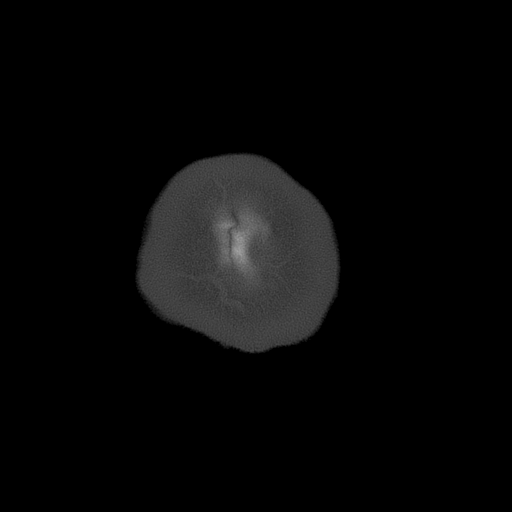

[13 of 30 positions shown; findings below may reference images not displayed]

FINDINGS: No acute intracranial hemorrhage. No focal mass lesion. No CT
evidence of acute infarction. No midline shift or mass effect. No
hydrocephalus. Basilar cisterns are patent.

Paranasal sinuses and mastoid air cells are clear. Extensive
pneumatization of the mastoid air cells and petrous apex.
IMPRESSION: Normal head CT.

## 2017-07-29 ENCOUNTER — Other Ambulatory Visit: Payer: Self-pay

## 2017-07-29 ENCOUNTER — Encounter: Payer: Self-pay | Admitting: Emergency Medicine

## 2017-07-29 ENCOUNTER — Emergency Department: Payer: PRIVATE HEALTH INSURANCE

## 2017-07-29 ENCOUNTER — Emergency Department
Admission: EM | Admit: 2017-07-29 | Discharge: 2017-07-29 | Disposition: A | Discharge status: Home / Self Care | Payer: PRIVATE HEALTH INSURANCE | Attending: Emergency Medicine | Admitting: Emergency Medicine

## 2017-07-29 DIAGNOSIS — F17200 Nicotine dependence, unspecified, uncomplicated: Secondary | ICD-10-CM | POA: Insufficient documentation

## 2017-07-29 DIAGNOSIS — S50811A Abrasion of right forearm, initial encounter: Secondary | ICD-10-CM | POA: Insufficient documentation

## 2017-07-29 DIAGNOSIS — S0083XA Contusion of other part of head, initial encounter: Secondary | ICD-10-CM | POA: Insufficient documentation

## 2017-07-29 DIAGNOSIS — Y9259 Other trade areas as the place of occurrence of the external cause: Secondary | ICD-10-CM | POA: Insufficient documentation

## 2017-07-29 DIAGNOSIS — Y999 Unspecified external cause status: Secondary | ICD-10-CM | POA: Insufficient documentation

## 2017-07-29 DIAGNOSIS — Y9389 Activity, other specified: Secondary | ICD-10-CM | POA: Insufficient documentation

## 2017-07-29 DIAGNOSIS — M545 Low back pain: Secondary | ICD-10-CM | POA: Insufficient documentation

## 2017-07-29 DIAGNOSIS — S5011XA Contusion of right forearm, initial encounter: Secondary | ICD-10-CM

## 2017-07-29 MED ORDER — TRAMADOL HCL 50 MG PO TABS
50.0000 mg | ORAL_TABLET | Freq: Once | ORAL | Status: AC
  Administered 2017-07-29: 50 mg via ORAL

## 2017-07-29 MED ORDER — MELOXICAM 15 MG PO TABS: 15.0000 mg | ORAL_TABLET | Freq: Every day | ORAL | 2 refills | Status: AC

## 2017-07-29 MED ORDER — TRAMADOL HCL 50 MG PO TABS
ORAL_TABLET | ORAL | Status: AC
  Filled 2017-07-29: qty 1

## 2017-07-29 NOTE — Discharge Instructions (Addendum)
Follow-up with your regular doctor if not better in 3 to 5 days.  Use Neosporin to the abrasion.  Apply ice to any areas that hurt.  You have been given a once a day pain medication.  Return to the emergency department if your worsening
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
## 2017-08-26 ENCOUNTER — Other Ambulatory Visit: Payer: Self-pay

## 2017-08-26 ENCOUNTER — Emergency Department: Payer: PRIVATE HEALTH INSURANCE

## 2017-08-26 ENCOUNTER — Emergency Department
Admission: EM | Admit: 2017-08-26 | Discharge: 2017-08-26 | Disposition: A | Discharge status: Home / Self Care | Payer: PRIVATE HEALTH INSURANCE | Attending: Emergency Medicine | Admitting: Emergency Medicine

## 2017-08-26 DIAGNOSIS — F17210 Nicotine dependence, cigarettes, uncomplicated: Secondary | ICD-10-CM | POA: Insufficient documentation

## 2017-08-26 DIAGNOSIS — J209 Acute bronchitis, unspecified: Secondary | ICD-10-CM | POA: Insufficient documentation

## 2017-08-26 MED ORDER — AZITHROMYCIN 250 MG PO TABS: ORAL_TABLET | ORAL | 0 refills | Status: AC

## 2017-08-26 MED ORDER — PREDNISONE 50 MG PO TABS: ORAL_TABLET | ORAL | 0 refills | Status: DC

## 2017-08-26 NOTE — ED Triage Notes (Signed)
Pt states cough with body aches and intermittent fever for a week. Pt states has green sputum production. Moist oral mucus membranes noted. Pt appears in no acute distress.

## 2017-08-26 NOTE — ED Provider Notes (Signed)
[facility] Emergency Department Provider Note  ____________________________________________  Time seen: Approximately 11:06 PM  I have reviewed the triage vital signs and the nursing notes.   HISTORY  Chief Complaint Cough    HPI Shaun Rivas is a 32 y.o. male presents to the emergency department with self-reported fever, cough, intermittent shortness of breath and malaise for approximately one week.  Patient has been tolerating fluids by mouth with no major changes in stooling or urinary habits.  No emesis.  No alleviating measures have been attempted.   Past Medical History:  Diagnosis Date  . Wears glasses     There are no active problems to display for this patient.   Past Surgical History:  Procedure Laterality Date  . FINGER FRACTURE SURGERY     R ring finger  . LACERATION REPAIR      Prior to Admission medications   Medication Sig Start Date End Date Taking? Authorizing Provider  azithromycin (ZITHROMAX Z-PAK) 250 MG tablet Take 2 tablets (500 mg) on  Day 1,  followed by 1 tablet (250 mg) once daily on Days 2 through 5. 08/26/17 08/31/17  Orvil Feil, PA-C  meloxicam (MOBIC) 15 MG tablet Take 1 tablet (15 mg total) by mouth daily. 07/29/17 07/29/18  Fisher, Roselyn Bering, PA-C  predniSONE (DELTASONE) 50 MG tablet Take one 50 mg tablet once daily for the next five days. 08/26/17   Orvil Feil, PA-C    Allergies Patient has no known allergies.  Family History  Problem Relation Age of Onset  . Other Father        died of gunshot  . Diabetes Maternal Grandfather   . Cancer Neg Hx   . Heart disease Neg Hx   . Hypertension Neg Hx   . Stroke Neg Hx     Social History Social History   Tobacco Use  . Smoking status: Heavy Tobacco Smoker    Packs/day: 1.00    Years: 1.00    Pack years: 1.00  . Smokeless tobacco: Never Used  Substance Use Topics  . Alcohol use: Yes    Comment: occasional  . Drug use: Yes    Types: Marijuana      Review of Systems  Constitutional: Patient has fever. Eyes: No visual changes. No discharge ENT: No upper respiratory complaints. Cardiovascular: no chest pain. Respiratory: Patient has productive cough.  Gastrointestinal: No abdominal pain.  No nausea, no vomiting.  No diarrhea.  No constipation. Musculoskeletal: Negative for musculoskeletal pain. Skin: Negative for rash, abrasions, lacerations, ecchymosis. Neurological: Negative for headaches, focal weakness or numbness.   ____________________________________________   PHYSICAL EXAM:  VITAL SIGNS: ED Triage Vitals [08/26/17 2011]  Enc Vitals Group     BP (!) 143/105     Pulse Rate 96     Resp 20     Temp 99.3 F (37.4 C)     Temp Source Oral     SpO2 97 %     Weight 155 lb (70.3 kg)     Height  (1.905 m)     Head Circumference      Peak Flow      Pain Score 0     Pain Loc      Pain Edu?      Excl. in GC?      Constitutional: Alert and oriented. Well appearing and in no acute distress. Eyes: Conjunctivae are normal. PERRL. EOMI. Head: Atraumatic. ENT:      Ears: TMs are pearly.  Nose: No congestion/rhinnorhea.      Mouth/Throat: Mucous membranes are moist.  Neck: No stridor.  No cervical spine tenderness to palpation. Cardiovascular: Normal rate, regular rhythm. Normal S1 and S2.  Good peripheral circulation. Respiratory: Normal respiratory effort without tachypnea or retractions. Lungs CTAB. Good air entry to the bases with no decreased or absent breath sounds. Musculoskeletal: Full range of motion to all extremities. No gross deformities appreciated. Neurologic:  Normal speech and language. No gross focal neurologic deficits are appreciated.  Skin:  Skin is warm, dry and intact. No rash noted.  ____________________________________________   LABS (all labs ordered are listed, but only abnormal results are displayed)  Labs Reviewed - No data to  display ____________________________________________  EKG   ____________________________________________  RADIOLOGY Geraldo Pitter, personally viewed and evaluated these images (plain radiographs) as part of my medical decision making, as well as reviewing the written report by the radiologist.  Dg Chest 2 View  Result Date: 08/26/2017 CLINICAL DATA:  Cough and fever for 1 week. EXAM: CHEST - 2 VIEW COMPARISON:  04/04/2015 chest radiograph FINDINGS: The cardiomediastinal silhouette is unremarkable. There is no evidence of focal airspace disease, pulmonary edema, suspicious pulmonary nodule/mass, pleural effusion, or pneumothorax. No acute bony abnormalities are identified. IMPRESSION: No active cardiopulmonary disease. Electronically Signed   By: Harmon Pier M.D.   On: 08/26/2017 20:28    ____________________________________________    PROCEDURES  Procedure(s) performed:    Procedures    Medications - No data to display   ____________________________________________   INITIAL IMPRESSION / ASSESSMENT AND PLAN / ED COURSE  Pertinent labs & imaging results that were available during my care of the patient were reviewed by me and considered in my medical decision making (see chart for details).  Review of the  [place] was performed in accordance of the [place] prior to dispensing any controlled drugs.     Assessment and plan Acute bronchitis Patient presents to the emergency department with productive cough for the past 7 days.  Differential diagnosis include acute bronchitis versus community-acquired pneumonia.  No consolidations or findings consistent with pneumonia were visualized on chest x-ray.  Patient was treated empirically with azithromycin and prednisone and advised to follow-up with primary care as needed.  All patient questions were answered.     ____________________________________________  FINAL CLINICAL IMPRESSION(S) / ED DIAGNOSES  Final diagnoses:   Acute bronchitis, unspecified organism      NEW MEDICATIONS STARTED DURING THIS VISIT:  ED Discharge Orders        Ordered    azithromycin (ZITHROMAX Z-PAK) 250 MG tablet     08/26/17 2243    predniSONE (DELTASONE) 50 MG tablet     08/26/17 2243          This chart was dictated using voice recognition software/Dragon. Despite best efforts to proofread, errors can occur which can change the meaning. Any change was purely unintentional.    Orvil Feil, PA-C 08/26/17 2310    Arnaldo Natal, MD 08/26/17 [phone]

## 2017-09-22 ENCOUNTER — Encounter: Payer: Self-pay | Admitting: Emergency Medicine

## 2017-09-22 ENCOUNTER — Emergency Department
Admission: EM | Admit: 2017-09-22 | Discharge: 2017-09-22 | Disposition: A | Discharge status: Home / Self Care | Payer: PRIVATE HEALTH INSURANCE | Attending: Emergency Medicine | Admitting: Emergency Medicine

## 2017-09-22 DIAGNOSIS — Z5321 Procedure and treatment not carried out due to patient leaving prior to being seen by health care provider: Secondary | ICD-10-CM | POA: Insufficient documentation

## 2017-09-22 DIAGNOSIS — M79641 Pain in right hand: Secondary | ICD-10-CM | POA: Diagnosis present
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
## 2017-11-03 ENCOUNTER — Emergency Department
Admission: EM | Admit: 2017-11-03 | Discharge: 2017-11-04 | Disposition: A | Discharge status: Home / Self Care | Payer: PRIVATE HEALTH INSURANCE | Attending: Emergency Medicine | Admitting: Emergency Medicine

## 2017-11-03 ENCOUNTER — Encounter: Payer: Self-pay | Admitting: Emergency Medicine

## 2017-11-03 DIAGNOSIS — F17200 Nicotine dependence, unspecified, uncomplicated: Secondary | ICD-10-CM | POA: Insufficient documentation

## 2017-11-03 DIAGNOSIS — Y93E9 Activity, other interior property and clothing maintenance: Secondary | ICD-10-CM | POA: Insufficient documentation

## 2017-11-03 DIAGNOSIS — Y92018 Other place in single-family (private) house as the place of occurrence of the external cause: Secondary | ICD-10-CM | POA: Insufficient documentation

## 2017-11-03 DIAGNOSIS — Y999 Unspecified external cause status: Secondary | ICD-10-CM | POA: Insufficient documentation

## 2017-11-03 DIAGNOSIS — Z79899 Other long term (current) drug therapy: Secondary | ICD-10-CM | POA: Insufficient documentation

## 2017-11-03 DIAGNOSIS — W230XXA Caught, crushed, jammed, or pinched between moving objects, initial encounter: Secondary | ICD-10-CM | POA: Insufficient documentation

## 2017-11-03 DIAGNOSIS — S61212A Laceration without foreign body of right middle finger without damage to nail, initial encounter: Secondary | ICD-10-CM | POA: Insufficient documentation

## 2017-11-03 MED ORDER — BACITRACIN ZINC 500 UNIT/GM EX OINT
1.0000 "application " | TOPICAL_OINTMENT | Freq: Once | CUTANEOUS | Status: AC
  Administered 2017-11-03: 1 via TOPICAL
  Filled 2017-11-03: qty 0.9

## 2017-11-03 MED ORDER — LIDOCAINE HCL (PF) 1 % IJ SOLN
5.0000 mL | Freq: Once | INTRAMUSCULAR | Status: AC
Start: 2017-11-03 — End: 2017-11-03
  Administered 2017-11-03: 5 mL via INTRADERMAL
  Filled 2017-11-03: qty 5

## 2017-11-03 NOTE — ED Triage Notes (Signed)
Pt lacerated the 3rd digit on the right hand with air conditioner fan the is evening. Approximate 1 inch laceration and 1/2 inch laceration to the digit. Bandage applied and bleeding controled.

## 2017-11-03 NOTE — ED Provider Notes (Signed)
[facility] Emergency Department Provider Note  ____________________________________________  Time seen: Approximately 10:30 PM  I have reviewed the triage vital signs and the nursing notes.   HISTORY  Chief Complaint Laceration   HPI Shaun Rivas is a 32 y.o. male who presents to the emergency department for treatment and evaluation of a laceration to his right middle finger.  He went upstairs to try and fix the attic fan and the blade started to turn and cut his finger.  Tetanus vaccination is up-to-date.   Past Medical History:  Diagnosis Date  . Wears glasses     There are no active problems to display for this patient.   Past Surgical History:  Procedure Laterality Date  . FINGER FRACTURE SURGERY     R ring finger  . LACERATION REPAIR      Prior to Admission medications   Medication Sig Start Date End Date Taking? Authorizing Provider  meloxicam (MOBIC) 15 MG tablet Take 1 tablet (15 mg total) by mouth daily. 07/29/17 07/29/18  Fisher, Roselyn Bering, PA-C  predniSONE (DELTASONE) 50 MG tablet Take one 50 mg tablet once daily for the next five days. 08/26/17   Orvil Feil, PA-C    Allergies Patient has no known allergies.  Family History  Problem Relation Age of Onset  . Other Father        died of gunshot  . Diabetes Maternal Grandfather   . Cancer Neg Hx   . Heart disease Neg Hx   . Hypertension Neg Hx   . Stroke Neg Hx     Social History Social History   Tobacco Use  . Smoking status: Heavy Tobacco Smoker    Packs/day: 1.00    Years: 1.00    Pack years: 1.00  . Smokeless tobacco: Never Used  Substance Use Topics  . Alcohol use: Yes    Comment: occasional  . Drug use: Yes    Types: Marijuana    Review of Systems  Constitutional: Negative for fever. Respiratory: Negative for cough or shortness of breath.  Musculoskeletal: Negative for myalgias Skin: Positive for laceration to the right middle finger Neurological:  Negative for numbness or paresthesias. ____________________________________________   PHYSICAL EXAM:  VITAL SIGNS: ED Triage Vitals [11/03/17 2108]  Enc Vitals Group     BP 122/77     Pulse Rate 82     Resp 17     Temp 98 F (36.7 C)     Temp Source Oral     SpO2 99 %     Weight      Height      Head Circumference      Peak Flow      Pain Score      Pain Loc      Pain Edu?      Excl. in GC?      Constitutional: Well appearing. Eyes: Conjunctivae are clear without discharge or drainage. Nose: No rhinorrhea noted. Mouth/Throat: Airway is patent.  Neck: No stridor. Unrestricted range of motion observed. Cardiovascular: Capillary refill is <3 seconds.  Respiratory: Respirations are even and unlabored.. Musculoskeletal: Unrestricted range of motion observed. Neurologic: Awake, alert, and oriented x 4.  Skin: 1 cm laceration to the dorsal aspect of the right long finger.  2 cm laceration on the palmar aspect of the right long finger.  ____________________________________________   LABS (all labs ordered are listed, but only abnormal results are displayed)  Labs Reviewed - No data to display ____________________________________________  EKG  Not indicated. ____________________________________________  RADIOLOGY  Not indicated ____________________________________________   PROCEDURES  .Marland Kitchen.Laceration Repair Date/Time: 11/03/2017 11:47 PM Performed by: Chinita Pesterriplett, Sandia Pfund B, FNP Authorized by: Chinita Pesterriplett, Micalah Cabezas B, FNP   Consent:    Consent obtained:  Verbal   Consent given by:  Patient   Risks discussed:  Infection, pain and poor wound healing Anesthesia (see MAR for exact dosages):    Anesthesia method:  Nerve block   Block needle gauge:  25 G   Block anesthetic:  Lidocaine 1% w/o epi   Block injection procedure:  Introduced needle and anatomic landmarks identified   Block outcome:  Anesthesia achieved Laceration details:    Location:  Finger   Finger location:   R long finger   Length (cm):  3.5 Repair type:    Repair type:  Simple Pre-procedure details:    Preparation:  Patient was prepped and draped in usual sterile fashion Exploration:    Contaminated: no   Treatment:    Area cleansed with:  Betadine and saline   Amount of cleaning:  Standard   Irrigation solution:  Sterile saline   Irrigation method:  Tap Skin repair:    Repair method:  Sutures   Suture size:  5-0   Suture technique:  Simple interrupted   Number of sutures:  9 Approximation:    Approximation:  Loose Post-procedure details:    Dressing:  Antibiotic ointment   ____________________________________________   INITIAL IMPRESSION / ASSESSMENT AND PLAN / ED COURSE  Achilles DunkMichael C Canepa is a 32 y.o. male who presents to the emergency department for treatment and evaluation of laceration to his right middle finger.  Wound was cleaned and sutures inserted following sterile procedure.  He was instructed to have the sutures removed in approximately 10 days.  He was instructed to return to the emergency department or see his primary care provider sooner for symptoms or concern of infection.   Medications  lidocaine (PF) (XYLOCAINE) 1 % injection 5 mL (5 mLs Intradermal Given 11/03/17 2247)  bacitracin ointment 1 application (1 application Topical Given 11/03/17 2347)     Pertinent labs & imaging results that were available during my care of the patient were reviewed by me and considered in my medical decision making (see chart for details).  ____________________________________________   FINAL CLINICAL IMPRESSION(S) / ED DIAGNOSES  Final diagnoses:  Laceration of right middle finger without foreign body without damage to nail, initial encounter    ED Discharge Orders    None       Note:  This document was prepared using Dragon voice recognition software and may include unintentional dictation errors.    Chinita Pesterriplett, Lafonda Patron B, FNP 11/03/17 2350    Dionne BucySiadecki, Sebastian,  MD 11/06/17 1510

## 2017-11-03 NOTE — ED Notes (Signed)
Two lacerations to R 3rd digit, sustained while repairing AC unit, cut on a fan. Bacitracin applied to both suture lines and bandage covering both applied.

## 2017-11-03 NOTE — Discharge Instructions (Signed)
Do not get the sutured area wet for 24 hours. After 24 hours, shower/bathe as usual and pat the area dry. °Change the bandage 2 times per day and apply antibiotic ointment. °Leave open to air when at no risk of getting the area dirty, but cover at night before bed. °See your PCP or go to Urgent Care in 10 days for suture removal or sooner for signs or concern of infection. ° °

## 2018-06-23 ENCOUNTER — Emergency Department (HOSPITAL_COMMUNITY): Admission: EM | Admit: 2018-06-23 | Discharge: 2018-06-23 | Discharge status: Against Medical Advice | Payer: Self-pay

## 2018-06-23 ENCOUNTER — Emergency Department
Admission: EM | Admit: 2018-06-23 | Discharge: 2018-06-24 | Disposition: A | Discharge status: Home / Self Care | Payer: BLUE CROSS/BLUE SHIELD | Attending: Student in an Organized Health Care Education/Training Program | Admitting: Student in an Organized Health Care Education/Training Program

## 2018-06-23 ENCOUNTER — Other Ambulatory Visit: Payer: Self-pay

## 2018-06-23 DIAGNOSIS — Y999 Unspecified external cause status: Secondary | ICD-10-CM | POA: Insufficient documentation

## 2018-06-23 DIAGNOSIS — Z79899 Other long term (current) drug therapy: Secondary | ICD-10-CM | POA: Diagnosis not present

## 2018-06-23 DIAGNOSIS — S0181XA Laceration without foreign body of other part of head, initial encounter: Secondary | ICD-10-CM | POA: Insufficient documentation

## 2018-06-23 DIAGNOSIS — F17200 Nicotine dependence, unspecified, uncomplicated: Secondary | ICD-10-CM | POA: Insufficient documentation

## 2018-06-23 DIAGNOSIS — Z23 Encounter for immunization: Secondary | ICD-10-CM | POA: Insufficient documentation

## 2018-06-23 DIAGNOSIS — Y929 Unspecified place or not applicable: Secondary | ICD-10-CM | POA: Diagnosis not present

## 2018-06-23 DIAGNOSIS — W2209XA Striking against other stationary object, initial encounter: Secondary | ICD-10-CM | POA: Diagnosis not present

## 2018-06-23 DIAGNOSIS — Y93K1 Activity, walking an animal: Secondary | ICD-10-CM | POA: Diagnosis not present

## 2018-06-23 MED ORDER — LIDOCAINE-EPINEPHRINE-TETRACAINE (LET) SOLUTION
3.0000 mL | Freq: Once | NASAL | Status: AC
Start: 2018-06-23 — End: 2018-06-23
  Administered 2018-06-23: 3 mL via TOPICAL
  Filled 2018-06-23: qty 3

## 2018-06-23 MED ORDER — LIDOCAINE HCL (PF) 1 % IJ SOLN
5.0000 mL | Freq: Once | INTRAMUSCULAR | Status: DC
  Filled 2018-06-23: qty 5

## 2018-06-23 NOTE — ED Triage Notes (Signed)
Pt states he hit his head on a doorway approx 2100 tonight. Pt with one inch linear laceration. Pt without bleeding, denies loc.

## 2018-06-23 NOTE — ED Notes (Signed)
Pt at [facility]

## 2018-06-23 NOTE — ED Notes (Signed)
Pt took blood pressure cuff off and walked out of the ED

## 2018-06-23 NOTE — ED Provider Notes (Signed)
[facility] Emergency Department Provider Note    First MD Initiated Contact with Patient 06/23/18 2329     (approximate)  I have reviewed the triage vital signs and the nursing notes.   HISTORY  Chief Complaint Laceration    HPI Shaun Rivas is a 33 y.o. male presents to the ER for evaluation of laceration to his forehead that occurred this evening while he was walking the dog.  States he was walking and hit his head on a screen door.  Did not lose consciousness.  States he had some bleeding from the site after.  Denies any other injury.  Past Medical History:  Diagnosis Date   Wears glasses    Family History  Problem Relation Age of Onset   Other Father        died of gunshot   Diabetes Maternal Grandfather    Cancer Neg Hx    Heart disease Neg Hx    Hypertension Neg Hx    Stroke Neg Hx    Past Surgical History:  Procedure Laterality Date   FINGER FRACTURE SURGERY     R ring finger   LACERATION REPAIR     There are no active problems to display for this patient.     Prior to Admission medications   Medication Sig Start Date End Date Taking? Authorizing Provider  meloxicam (MOBIC) 15 MG tablet Take 1 tablet (15 mg total) by mouth daily. 07/29/17 07/29/18  Fisher, Roselyn Bering, PA-C  predniSONE (DELTASONE) 50 MG tablet Take one 50 mg tablet once daily for the next five days. 08/26/17   Orvil Feil, PA-C    Allergies Patient has no known allergies.    Social History Social History   Tobacco Use   Smoking status: Heavy Tobacco Smoker    Packs/day: 1.00    Years: 1.00    Pack years: 1.00   Smokeless tobacco: Never Used  Substance Use Topics   Alcohol use: Yes    Comment: occasional   Drug use: Yes    Types: Marijuana    Review of Systems Patient denies headaches, rhinorrhea, blurry vision, numbness, shortness of breath, chest pain, edema, cough, abdominal pain, nausea, vomiting, diarrhea, dysuria, fevers, rashes  or hallucinations unless otherwise stated above in HPI. ____________________________________________   PHYSICAL EXAM:  VITAL SIGNS: Vitals:   06/23/18 2244  BP: 123/80  Pulse: 60  Resp: 16  Temp: 98 F (36.7 C)  SpO2: 100%    Constitutional: Alert and oriented. Well appearing and in no acute distress. Eyes: Conjunctivae are normal.  Head: 2cm laceration of the forehead no foreign bodies Nose: No congestion/rhinnorhea. Mouth/Throat: Mucous membranes are moist.   Neck: Painless ROM.  Cardiovascular:   Good peripheral circulation. Respiratory: Normal respiratory effort.  No retractions.  Gastrointestinal: Soft and nontender.  Musculoskeletal: No lower extremity tenderness .  No joint effusions. Neurologic:  Normal speech and language. No gross focal neurologic deficits are appreciated.  Skin:  Skin is warm, dry and intact. No rash noted. Psychiatric: Mood and affect are normal. Speech and behavior are normal.  ____________________________________________   LABS (all labs ordered are listed, but only abnormal results are displayed)  No results found for this or any previous visit (from the past 24 hour(s)). ____________________________________________ ______________________________   PROCEDURES  Procedure(s) performed:  Marland KitchenMarland KitchenLaceration Repair Date/Time: 06/24/2018 12:19 AM Performed by: Willy Eddy, MD Authorized by: Willy Eddy, MD   Consent:    Consent obtained:  Verbal   Consent given  by:  Patient   Risks discussed:  Infection, pain, retained foreign body, poor cosmetic result and poor wound healing Anesthesia (see MAR for exact dosages):    Anesthesia method:  Local infiltration   Local anesthetic:  Lidocaine 1% w/o epi Laceration details:    Location:  Scalp   Scalp location:  Frontal   Length (cm):  2   Depth (mm):  2 Repair type:    Repair type:  Simple Exploration:    Hemostasis achieved with:  Direct pressure   Wound exploration: entire  depth of wound probed and visualized     Contaminated: no   Treatment:    Area cleansed with:  Saline and Betadine   Amount of cleaning:  Standard   Irrigation solution:  Sterile saline   Visualized foreign bodies/material removed: no   Skin repair:    Repair method:  Staples   Number of staples:  3 Approximation:    Approximation:  Close Post-procedure details:    Dressing:  Sterile dressing   Patient tolerance of procedure:  Tolerated well, no immediate complications      Critical Care performed: no ____________________________________________   INITIAL IMPRESSION / ASSESSMENT AND PLAN / ED COURSE  Pertinent labs & imaging results that were available during my care of the patient were reviewed by me and considered in my medical decision making (see chart for details).  DDX: laceration, contusion, abrasion  Shaun Rivas is a 33 y.o. who presents to the ED with 2 cm laceration on forehead. No distal loss of sensation of motor deficit. Denies any other injuries. Td updated. VSS. Exam with distal NV intact. No functional tendon deficit, no sensory deficit. No signs of erythema  or surrounding infection. Plan lac repair.  Laceration was explored, irrigated, and repaired without complications. No FB in a bloodless field.    Discussed at length wound care and infection precautions with patient.       ____________________________________________   FINAL CLINICAL IMPRESSION(S) / ED DIAGNOSES  Final diagnoses:  Laceration of forehead without complication, initial encounter      NEW MEDICATIONS STARTED DURING THIS VISIT:  New Prescriptions   No medications on file     Note:  This document was prepared using Dragon voice recognition software and may include unintentional dictation errors.     Willy Eddy, MD 06/24/18 Perlie Mayo

## 2018-06-24 MED ORDER — TETANUS-DIPHTH-ACELL PERTUSSIS 5-2.5-18.5 LF-MCG/0.5 IM SUSP
0.5000 mL | Freq: Once | INTRAMUSCULAR | Status: AC
  Administered 2018-06-24: 0.5 mL via INTRAMUSCULAR
  Filled 2018-06-24: qty 0.5

## 2018-06-24 MED ORDER — ACETAMINOPHEN 500 MG PO TABS
1000.0000 mg | ORAL_TABLET | Freq: Once | ORAL | Status: AC
  Administered 2018-06-24: 1000 mg via ORAL
  Filled 2018-06-24: qty 2

## 2018-06-24 NOTE — ED Notes (Signed)
Reviewed discharge instructions, follow-up care with patient. Patient verbalized understanding of all information reviewed. Patient stable, with no distress noted at this time.    

## 2019-01-02 IMAGING — CR DG CHEST 2V
1 series · 2 of 2 positions shown · non-contrast
Comparison: 04/04/2015 chest radiograph

CLINICAL DATA: Cough and fever for 1 week.

EXAM:
CHEST - 2 VIEW

[Series 1: dg chest 2 view · 0.14mm/px · 2 of 2 slices shown]
[im 1/2]
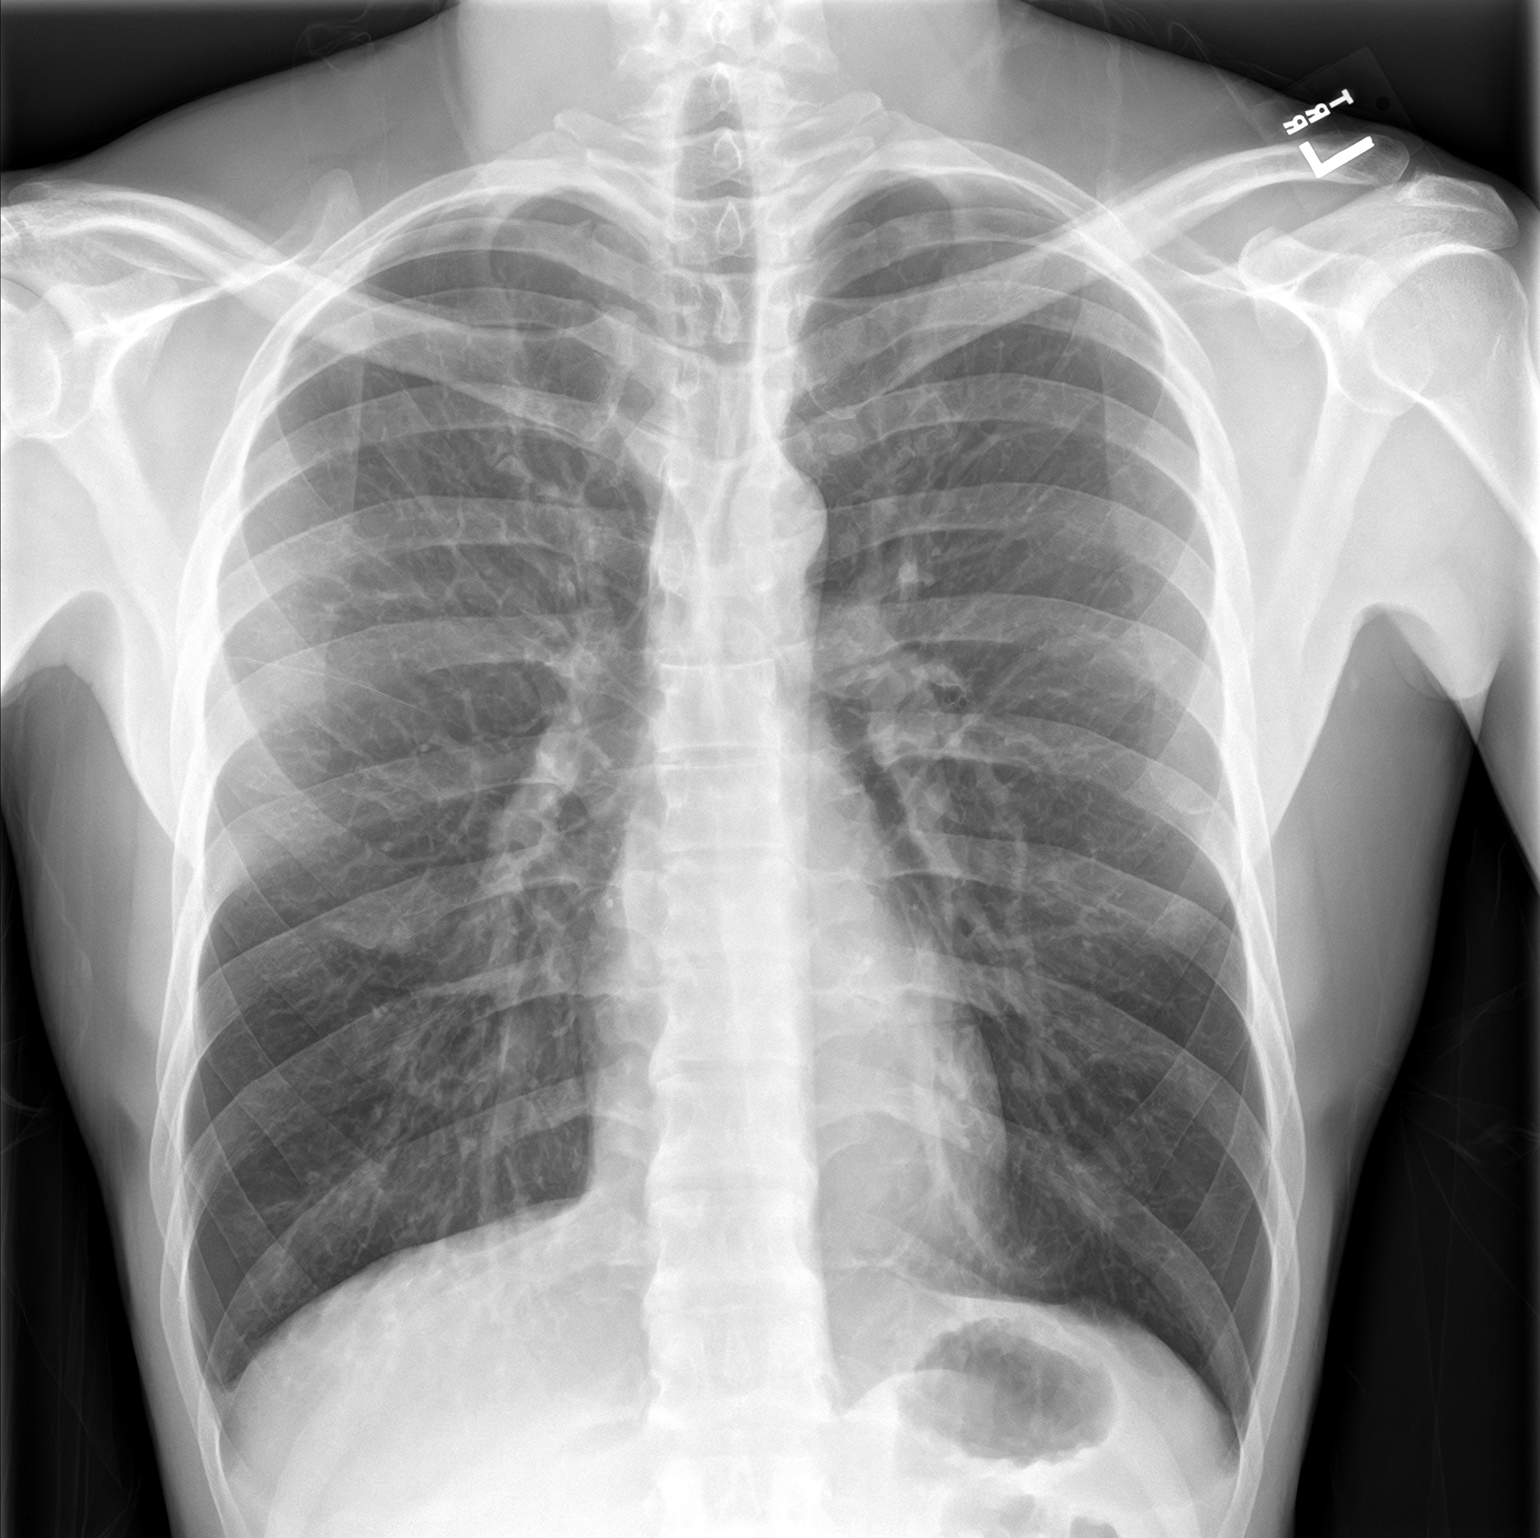
[im 2/2]
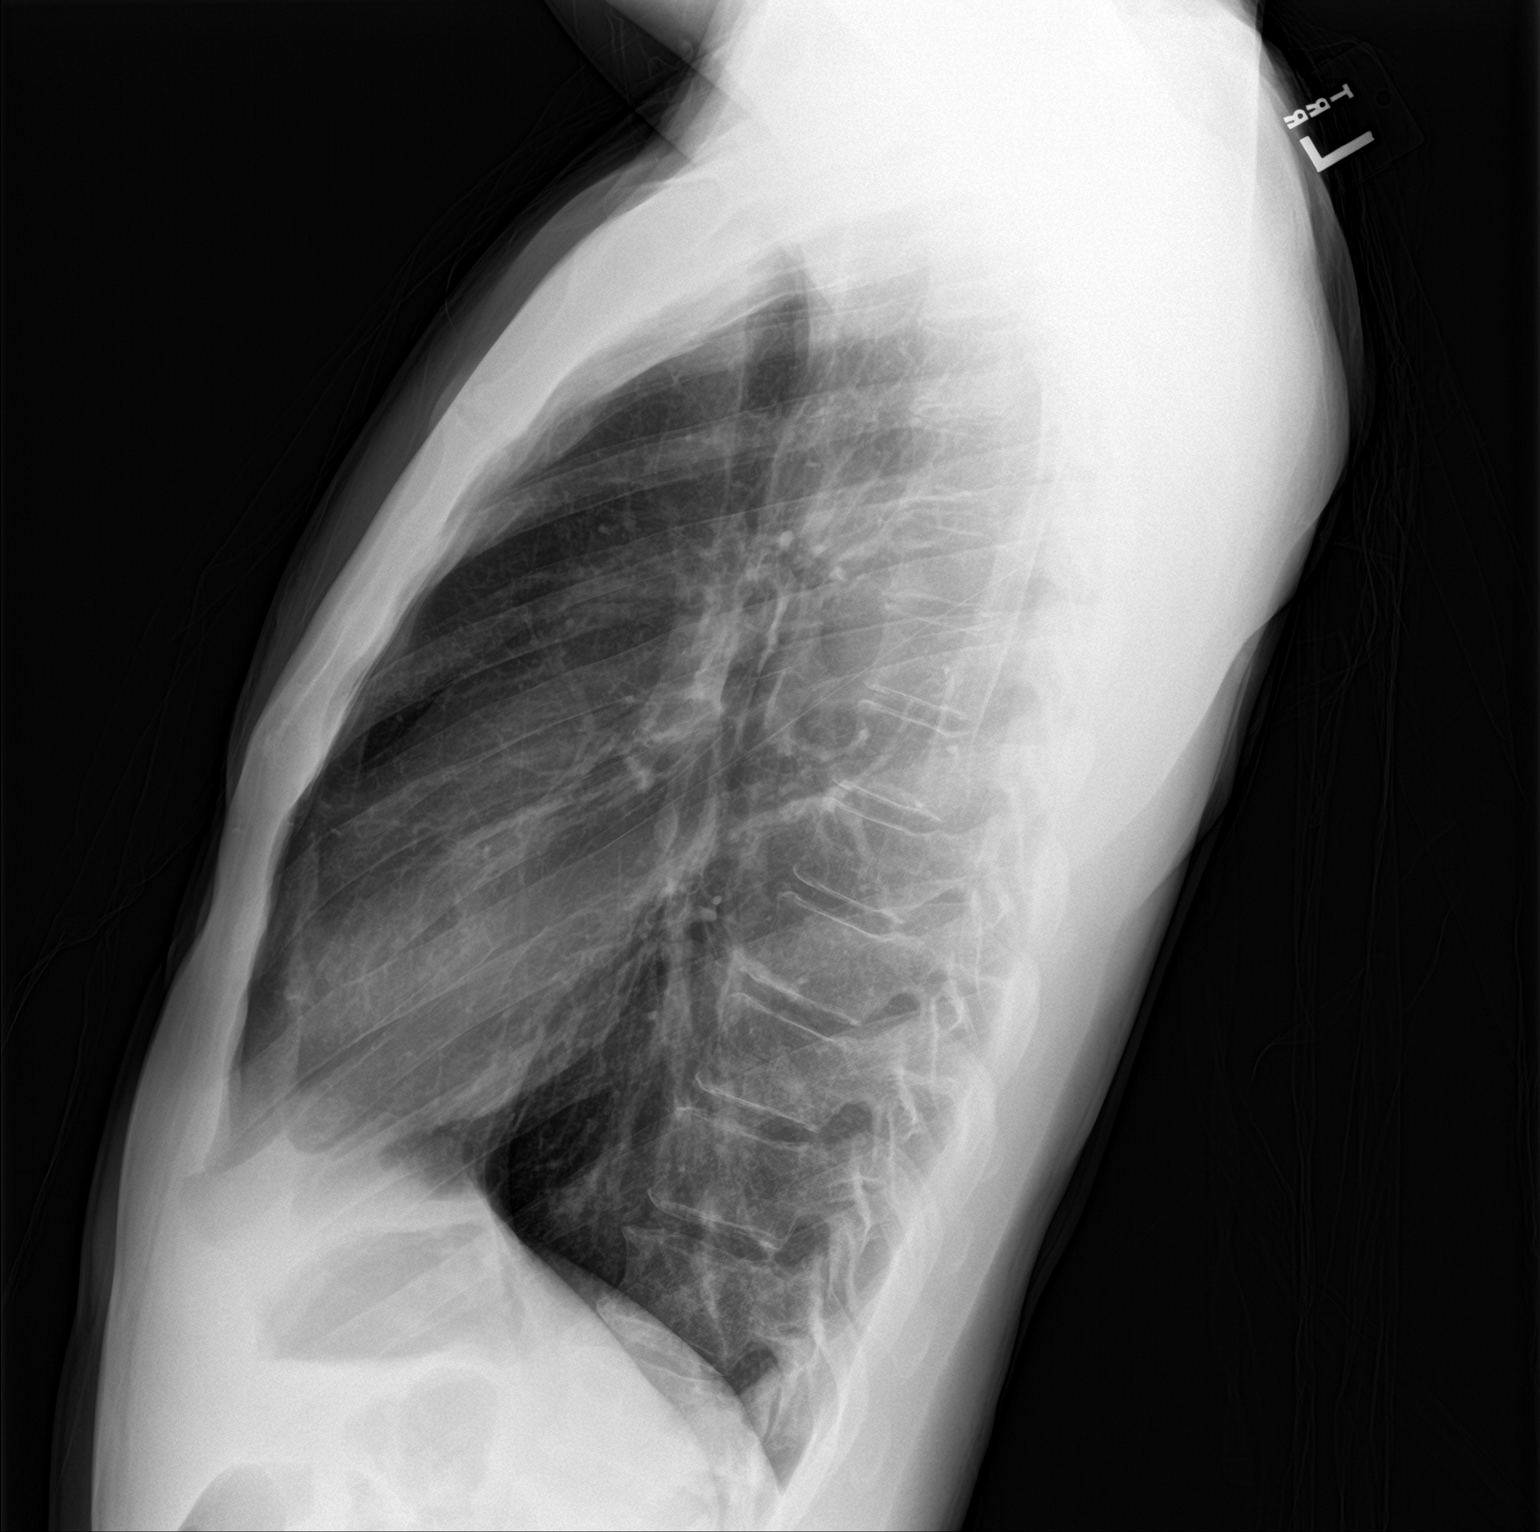

[2 of 2 positions shown; findings below may reference images not displayed]

FINDINGS: The cardiomediastinal silhouette is unremarkable.

There is no evidence of focal airspace disease, pulmonary edema,
suspicious pulmonary nodule/mass, pleural effusion, or pneumothorax.

No acute bony abnormalities are identified.
IMPRESSION: No active cardiopulmonary disease.

## 2019-06-26 ENCOUNTER — Encounter: Payer: Self-pay | Admitting: Emergency Medicine

## 2019-06-26 ENCOUNTER — Emergency Department
Admission: EM | Admit: 2019-06-26 | Discharge: 2019-06-26 | Disposition: A | Discharge status: Home / Self Care | Payer: BC Managed Care – PPO | Attending: Emergency Medicine | Admitting: Emergency Medicine

## 2019-06-26 ENCOUNTER — Other Ambulatory Visit: Payer: Self-pay

## 2019-06-26 DIAGNOSIS — R319 Hematuria, unspecified: Secondary | ICD-10-CM | POA: Diagnosis not present

## 2019-06-26 DIAGNOSIS — F17200 Nicotine dependence, unspecified, uncomplicated: Secondary | ICD-10-CM | POA: Diagnosis not present

## 2019-06-26 DIAGNOSIS — R112 Nausea with vomiting, unspecified: Secondary | ICD-10-CM | POA: Diagnosis present

## 2019-06-26 DIAGNOSIS — K529 Noninfective gastroenteritis and colitis, unspecified: Secondary | ICD-10-CM

## 2019-06-26 LAB — COMPREHENSIVE METABOLIC PANEL WITH GFR
ALT: 20 U/L (ref 0–44)
AST: 25 U/L (ref 15–41)
Albumin: 4.5 g/dL (ref 3.5–5.0)
Alkaline Phosphatase: 59 U/L (ref 38–126)
Anion gap: 10 (ref 5–15)
BUN: 27 mg/dL — ABNORMAL HIGH (ref 6–20)
CO2: 29 mmol/L (ref 22–32)
Calcium: 9.3 mg/dL (ref 8.9–10.3)
Chloride: 101 mmol/L (ref 98–111)
Creatinine, Ser: 1.24 mg/dL (ref 0.61–1.24)
GFR calc Af Amer: >60 mL/min
GFR calc non Af Amer: >60 mL/min
Glucose, Bld: 103 mg/dL — ABNORMAL HIGH (ref 70–99)
Potassium: 3.7 mmol/L (ref 3.5–5.1)
Sodium: 140 mmol/L (ref 135–145)
Total Bilirubin: 1.5 mg/dL — ABNORMAL HIGH (ref 0.3–1.2)
Total Protein: 7.2 g/dL (ref 6.5–8.1)

## 2019-06-26 LAB — URINALYSIS, COMPLETE (UACMP) WITH MICROSCOPIC
Bacteria, UA: NONE SEEN
Bilirubin Urine: NEGATIVE
Glucose, UA: NEGATIVE mg/dL
Ketones, ur: NEGATIVE mg/dL
Nitrite: NEGATIVE
Protein, ur: 30 mg/dL — AB
RBC / HPF: >50 RBC/hpf — ABNORMAL HIGH (ref 0–5)
Specific Gravity, Urine: 1.033 — ABNORMAL HIGH (ref 1.005–1.030)
Squamous Epithelial / LPF: NONE SEEN (ref 0–5)
pH: 5 (ref 5.0–8.0)

## 2019-06-26 LAB — CBC
HCT: 42.6 % (ref 39.0–52.0)
Hemoglobin: 14.3 g/dL (ref 13.0–17.0)
MCH: 30.8 pg (ref 26.0–34.0)
MCHC: 33.6 g/dL (ref 30.0–36.0)
MCV: 91.6 fL (ref 80.0–100.0)
Platelets: 182 10*3/uL (ref 150–400)
RBC: 4.65 MIL/uL (ref 4.22–5.81)
RDW: 11.7 % (ref 11.5–15.5)
WBC: 6.1 10*3/uL (ref 4.0–10.5)
nRBC: 0 % (ref 0.0–0.2)

## 2019-06-26 LAB — LIPASE, BLOOD: Lipase: 31 U/L (ref 11–51)

## 2019-06-26 MED ORDER — ONDANSETRON HCL 4 MG/2ML IJ SOLN
4.0000 mg | Freq: Once | INTRAMUSCULAR | Status: AC
  Administered 2019-06-26: 4 mg via INTRAVENOUS
  Filled 2019-06-26: qty 2

## 2019-06-26 MED ORDER — SODIUM CHLORIDE 0.9 % IV BOLUS
1000.0000 mL | Freq: Once | INTRAVENOUS | Status: AC
  Administered 2019-06-26: 1000 mL via INTRAVENOUS

## 2019-06-26 MED ORDER — ONDANSETRON HCL 4 MG PO TABS: 4.0000 mg | ORAL_TABLET | Freq: Three times a day (TID) | ORAL | 0 refills | Status: DC | PRN

## 2019-06-26 NOTE — ED Notes (Signed)
Pt states he typically chews candy at work to prevent him from chewing his finger nails. Pt states that last night at work he was unable to eat his lunch, states he began to have V/D through the rest of the night and today. Reports 3 episodes of diarrhea today with abdominal discomfort and general soreness. Pt denies pain.

## 2019-06-26 NOTE — ED Triage Notes (Signed)
Pt reports he consumed a large amount of candy on Monday night and since has had N/V/D and lower abdominal cramps.

## 2019-06-26 NOTE — ED Provider Notes (Signed)
[facility] Emergency Department Provider Note    ____________________________________________   I have reviewed the triage vital signs and the nursing notes.   HISTORY  Chief Complaint Nausea, vomiting and diarrhea  History limited by: Not Limited  HPI Shaun Rivas is a 34 y.o. male who presents to the emergency department today because of concerns for nausea vomiting diarrhea.  The patient states his symptoms started yesterday.  He does state that he ate a lot of candy yesterday.  He does this to try to prevent himself from chewing on his fingernails.  The patient denies any abdominal pain to me.  He does state that other members in his household have had similar abdominal issues today.  Patient denies any fevers.  He has not noticed any change in his urine.  Records reviewed. Per medical record review patient has a history of most recent ed visits for lacerations.  Past Medical History:  Diagnosis Date  . Wears glasses     There are no problems to display for this patient.   Past Surgical History:  Procedure Laterality Date  . FINGER FRACTURE SURGERY     R ring finger  . LACERATION REPAIR      Prior to Admission medications   Medication Sig Start Date End Date Taking? Authorizing Provider  predniSONE (DELTASONE) 50 MG tablet Take one 50 mg tablet once daily for the next five days. 08/26/17   Lannie Fields, PA-C    Allergies Patient has no known allergies.  Family History  Problem Relation Age of Onset  . Other Father        died of gunshot  . Diabetes Maternal Grandfather   . Cancer Neg Hx   . Heart disease Neg Hx   . Hypertension Neg Hx   . Stroke Neg Hx     Social History Social History   Tobacco Use  . Smoking status: Heavy Tobacco Smoker    Packs/day: 1.00    Years: 1.00    Pack years: 1.00  . Smokeless tobacco: Never Used  Substance Use Topics  . Alcohol use: Yes    Comment: occasional  . Drug use: Yes    Types:  Marijuana    Review of Systems Constitutional: No fever/chills Eyes: No visual changes. ENT: No sore throat. Cardiovascular: Denies chest pain. Respiratory: Denies shortness of breath. Gastrointestinal: No abdominal pain.  Positive for nausea, vomiting and diarrhea.  Genitourinary: Negative for dysuria. Musculoskeletal: Negative for back pain. Skin: Negative for rash. Neurological: Negative for headaches, focal weakness or numbness.  ____________________________________________   PHYSICAL EXAM:  VITAL SIGNS: ED Triage Vitals [06/26/19 1934]  Enc Vitals Group     BP 121/80     Pulse Rate 92     Resp 17     Temp 98.8 F (37.1 C)     Temp Source Oral     SpO2 100 %   Constitutional: Alert and oriented.  Eyes: Conjunctivae are normal.  ENT      Head: Normocephalic and atraumatic.      Nose: No congestion/rhinnorhea.      Mouth/Throat: Mucous membranes are moist.      Neck: No stridor. Hematological/Lymphatic/Immunilogical: No cervical lymphadenopathy. Cardiovascular: Normal rate, regular rhythm.  No murmurs, rubs, or gallops.  Respiratory: Normal respiratory effort without tachypnea nor retractions. Breath sounds are clear and equal bilaterally. No wheezes/rales/rhonchi. Gastrointestinal: Soft and non tender. No rebound. No guarding.  Genitourinary: Deferred Musculoskeletal: Normal range of motion in all extremities. No  lower extremity edema. Neurologic:  Normal speech and language. No gross focal neurologic deficits are appreciated.  Skin:  Skin is warm, dry and intact. No rash noted. Psychiatric: Mood and affect are normal. Speech and behavior are normal. Patient exhibits appropriate insight and judgment.  ____________________________________________    LABS (pertinent positives/negatives)  Lipase 31 CMP wnl except glu 103, bun 27, t bili 1.5 CBC wbc 6.1, hgb 14.3, plt 182 UA hazy, moderate hgb dipstick, trace leukocytes, >50 rbc, 6-10  wbc  ____________________________________________   EKG  None  ____________________________________________    RADIOLOGY  None   ____________________________________________   PROCEDURES  Procedures  ____________________________________________   INITIAL IMPRESSION / ASSESSMENT AND PLAN / ED COURSE  Pertinent labs & imaging results that were available during my care of the patient were reviewed by me and considered in my medical decision making (see chart for details).   Patient presented to the emergency department today because of concerns for nausea vomiting and diarrhea.  The patient did think this could be related to him eating a large amount of candy however in discussion with him he states that multiple members of his household have also had stomach upset.  At this point I think viral gastroenteritis more likely.  Blood work without concerning leukocytosis.  Patient's urine did have a fair amount of blood in it.  Patient without symptoms consistent with kidney stone.  I had a discussion with the patient about this bloody urine.  At this point do not feel any further emergent work-up is necessary however I did recommend him follow-up with urology. Will give iv fluids and antiemetics here.  ____________________________________________   FINAL CLINICAL IMPRESSION(S) / ED DIAGNOSES  Final diagnoses:  Gastroenteritis  Hematuria, unspecified type     Note: This dictation was prepared with Dragon dictation. Any transcriptional errors that result from this process are unintentional     Phineas Semen, MD 06/26/19 2251

## 2019-06-26 NOTE — Discharge Instructions (Addendum)
Please seek medical attention for any high fevers, chest pain, shortness of breath, change in behavior, persistent vomiting, bloody stool or any other new or concerning symptoms.  

## 2019-08-09 ENCOUNTER — Encounter (HOSPITAL_COMMUNITY): Payer: Self-pay

## 2019-08-09 ENCOUNTER — Other Ambulatory Visit: Payer: Self-pay

## 2019-08-09 ENCOUNTER — Ambulatory Visit (HOSPITAL_COMMUNITY)
Admission: EM | Admit: 2019-08-09 | Discharge: 2019-08-09 | Disposition: A | Discharge status: Home / Self Care | Payer: BC Managed Care – PPO | Attending: Family Medicine | Admitting: Family Medicine

## 2019-08-09 DIAGNOSIS — S39012A Strain of muscle, fascia and tendon of lower back, initial encounter: Secondary | ICD-10-CM

## 2019-08-09 MED ORDER — NAPROXEN 500 MG PO TABS: 500.0000 mg | ORAL_TABLET | Freq: Two times a day (BID) | ORAL | 0 refills | Status: AC

## 2019-08-09 MED ORDER — CYCLOBENZAPRINE HCL 10 MG PO TABS: 10.0000 mg | ORAL_TABLET | Freq: Two times a day (BID) | ORAL | 0 refills | Status: AC | PRN
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
## 2019-08-09 NOTE — Discharge Instructions (Addendum)
Take the medication as prescribed as needed for pain and muscle spasming.  Be aware the Flexeril to make you drowsy. Rest, alternate heat and ice to the area Follow up as needed for continued or worsening symptoms
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
## 2020-03-11 ENCOUNTER — Encounter (HOSPITAL_COMMUNITY): Payer: Self-pay

## 2020-03-11 ENCOUNTER — Other Ambulatory Visit: Payer: Self-pay

## 2020-03-11 ENCOUNTER — Ambulatory Visit (HOSPITAL_COMMUNITY)
Admission: EM | Admit: 2020-03-11 | Discharge: 2020-03-11 | Disposition: A | Discharge status: Home / Self Care | Payer: Self-pay | Attending: Family Medicine | Admitting: Family Medicine

## 2020-03-11 DIAGNOSIS — R52 Pain, unspecified: Secondary | ICD-10-CM

## 2020-03-11 DIAGNOSIS — T50Z95A Adverse effect of other vaccines and biological substances, initial encounter: Secondary | ICD-10-CM

## 2020-03-11 DIAGNOSIS — R110 Nausea: Secondary | ICD-10-CM

## 2020-03-11 MED ORDER — ONDANSETRON 4 MG PO TBDP: 4.0000 mg | ORAL_TABLET | Freq: Three times a day (TID) | ORAL | 0 refills | Status: AC | PRN

## 2020-03-11 NOTE — ED Triage Notes (Signed)
Pt presents with generalized bodyaches, soreness  In knees and lower back and nausea X 1 day.

## 2020-03-11 NOTE — Discharge Instructions (Signed)
Please do your best to ensure adequate fluid intake in order to avoid dehydration. If you find that you are unable to tolerate drinking fluids regularly please proceed to the Emergency Department for evaluation. ° ° °

## 2020-03-12 NOTE — ED Provider Notes (Signed)
°  [facility]   [id number] 03/11/20 Arrival Time: 1909  ASSESSMENT & PLAN:  1. Body aches after vaccination   2. Nausea     Expect symptoms to resolve over next 24 hours.  Meds ordered this encounter  Medications   ondansetron (ZOFRAN-ODT) 4 MG disintegrating tablet    Sig: Take 1 tablet (4 mg total) by mouth every 8 (eight) hours as needed for nausea or vomiting.    Dispense:  15 tablet    Refill:  0     Follow-up Information    Jac Canavan, PA-C.   Specialty: Family Medicine Why: As needed. Contact information: [address] [place] [address] [phone]               Reviewed expectations re: course of current medical issues. Questions answered. Outlined signs and symptoms indicating need for more acute intervention. Understanding verbalized. After Visit Summary given.   SUBJECTIVE: History from: patient. Shaun Rivas is a 34 y.o. male who reports body aches, chills, HA after receiving second COVID vaccine yesterday. Afebrile. Mild nausea without emesis. "Just feel bad".    OBJECTIVE:  Vitals:   03/11/20 2010  BP: 108/71  Pulse: 83  Resp: 18  Temp: 99.2 F (37.3 C)  TempSrc: Oral  SpO2: 100%    General appearance: alert; no distress Eyes: PERRLA; EOMI; conjunctiva normal HENT: [place]; AT; without nasal congestion Neck: supple  Lungs: speaks full sentences without difficulty; unlabored Extremities: no edema Skin: warm and dry Neurologic: normal gait Psychological: alert and cooperative; normal mood and affect    No Known Allergies  Past Medical History:  Diagnosis Date   Wears glasses    Social History   Socioeconomic History   Marital status: Single    Spouse name: Not on file   Number of children: Not on file   Years of education: Not on file   Highest education level: Not on file  Occupational History   Not on file  Tobacco Use   Smoking status: Heavy Tobacco Smoker    Packs/day: 1.00     Years: 1.00    Pack years: 1.00   Smokeless tobacco: Never Used  Substance and Sexual Activity   Alcohol use: Yes    Comment: occasional   Drug use: Yes    Types: Marijuana   Sexual activity: Yes  Other Topics Concern   Not on file  Social History Narrative   Works full-time at [employer], 10 months. Lives with grandmother. In a relationship with his girlfriend 5 months. Incarcerated from 2002 - 2014   Social Determinants of Health   Financial Resource Strain: Not on file  Food Insecurity: Not on file  Transportation Needs: Not on file  Physical Activity: Not on file  Stress: Not on file  Social Connections: Not on file  Intimate Partner Violence: Not on file   Family History  Problem Relation Age of Onset   Other Father        died of gunshot   Diabetes Maternal Grandfather    Cancer Neg Hx    Heart disease Neg Hx    Hypertension Neg Hx    Stroke Neg Hx    Past Surgical History:  Procedure Laterality Date   FINGER FRACTURE SURGERY     R ring finger   LACERATION REPAIR       Mardella Layman, MD 03/12/20 1002
